# Patient Record
Sex: Female | Born: 1980
Health system: Southern US, Community
[De-identification: ages and names within clinical notes are randomized; demographics above are authoritative.]

## PROBLEM LIST (undated history)

## (undated) HISTORY — PX: BREAST SURGERY: SHX581

---

## 2008-04-15 ENCOUNTER — Encounter: Admission: RE | Admit: 2008-04-15 | Discharge: 2008-04-15 | Payer: Self-pay | Admitting: Family Medicine

## 2008-04-15 ENCOUNTER — Ambulatory Visit: Payer: Self-pay | Admitting: Family Medicine

## 2008-07-14 ENCOUNTER — Encounter: Admission: RE | Admit: 2008-07-14 | Discharge: 2008-07-14 | Payer: Self-pay | Admitting: Family Medicine

## 2008-07-14 ENCOUNTER — Ambulatory Visit: Payer: Self-pay | Admitting: Family Medicine

## 2008-07-14 ENCOUNTER — Telehealth (INDEPENDENT_AMBULATORY_CARE_PROVIDER_SITE_OTHER): Payer: Self-pay | Admitting: *Deleted

## 2008-07-15 ENCOUNTER — Telehealth: Payer: Self-pay | Admitting: Family Medicine

## 2008-07-28 ENCOUNTER — Ambulatory Visit: Payer: Self-pay | Admitting: Family Medicine

## 2008-08-11 ENCOUNTER — Ambulatory Visit: Payer: Self-pay | Admitting: Family Medicine

## 2008-08-11 LAB — CONVERTED CEMR LAB
Nitrite: POSITIVE
Urobilinogen, UA: >=8
pH: 5

## 2008-08-17 ENCOUNTER — Ambulatory Visit: Payer: Self-pay | Admitting: Family Medicine

## 2008-08-17 ENCOUNTER — Telehealth: Payer: Self-pay | Admitting: Family Medicine

## 2008-08-17 LAB — CONVERTED CEMR LAB
Bilirubin Urine: NEGATIVE
Blood in Urine, dipstick: NEGATIVE
Ketones, urine, test strip: NEGATIVE
Nitrite: NEGATIVE

## 2009-02-22 ENCOUNTER — Ambulatory Visit: Payer: Self-pay | Admitting: Family Medicine

## 2009-02-22 DIAGNOSIS — F329 Major depressive disorder, single episode, unspecified: Secondary | ICD-10-CM

## 2009-02-22 DIAGNOSIS — F4323 Adjustment disorder with mixed anxiety and depressed mood: Secondary | ICD-10-CM | POA: Insufficient documentation

## 2009-02-25 ENCOUNTER — Ambulatory Visit: Payer: Self-pay | Admitting: Family Medicine

## 2009-03-07 ENCOUNTER — Telehealth: Payer: Self-pay | Admitting: Family Medicine

## 2009-07-14 ENCOUNTER — Ambulatory Visit: Payer: Self-pay | Admitting: Family Medicine

## 2010-02-02 ENCOUNTER — Ambulatory Visit: Payer: Self-pay | Admitting: Family Medicine

## 2010-02-22 ENCOUNTER — Telehealth: Payer: Self-pay | Admitting: Family Medicine

## 2010-04-13 ENCOUNTER — Telehealth: Payer: Self-pay | Admitting: Family Medicine

## 2010-06-19 ENCOUNTER — Ambulatory Visit: Payer: Self-pay | Admitting: Family Medicine

## 2010-06-19 LAB — CONVERTED CEMR LAB: Beta hcg, urine, semiquantitative: POSITIVE

## 2010-08-08 NOTE — Assessment & Plan Note (Signed)
Summary: Left breast pain, sinusitis   Vital Signs:  Patient profile:   29 year old female Height:      70 inches Weight:      182 pounds Temp:     97.8 degrees F oral Pulse rate:   78 / minute BP sitting:   122 / 79  (left arm) Cuff size:   regular  Vitals Entered By: Kathlene November (July 14, 2009 10:05 AM) CC: infection in left breast   Primary Care Provider:  Nani Gasser MD  CC:  infection in left breast.  History of Present Illness: Pain in the breast feels similar as before.  Can feel a hard lump. NO redness. Has a burning sensation. Last time it drained.  NO fever or chills. No pain relievers or compresses. Noticed it a week ago.  Last infection was earlier this year and follwed up with her breast surgeon who felt it was unrelated the augmetation.    Has had a URI for 4 weeks. Started with nausea and then got a severe head cold and dry cough, ST and ear ache. Then resolved for about 3 days and then started again.  Last week has mostly been a persistant cough, worse at night.  Still having ST, and post nasal drip. Nasal drianage is green/brown. No SOB.  Was taking sudafed for it - helped some. Had nipples peirced when younger but hasn't had rings in for almost 10 years. but that is where it drianed last time.   She is worried about the numbner of infections she has had this year.  has had 3 root canals for abscess teeth.  2 breast infections. She sais dhe had negative HIV testing earlier this year.   Current Medications (verified): 1)  Seasonique 0.15-0.03 &0.01 Mg Tabs (Levonorgest-Eth Estrad 91-Day) .... Take One Tablet By Mouth Oncea  Day 2)  Meclizine Hcl 25 Mg Tabs (Meclizine Hcl) .Marland Kitchen.. 1 Tab By Mouth Two Times A Day As Needed Vertigo 3)  Promethazine Hcl 25 Mg Tabs (Promethazine Hcl) .... One By Mouth Every 6 Hours As Needed For Nausea  Allergies (verified): No Known Drug Allergies  Comments:  Nurse/Medical Assistant: The patient's medications and allergies  were reviewed with the patient and were updated in the Medication and Allergy Lists. Kathlene November (July 14, 2009 10:06 AM)  Past History:  Past Surgical History: Breast augmentation 2009.    Physical Exam  General:  Well-developed,well-nourished,in no acute distress; alert,appropriate and cooperative throughout examination Head:  Normocephalic and atraumatic without obvious abnormalities. No apparent alopecia or balding. Eyes:  No corneal or conjunctival inflammation noted. EOMI. Perrla. Ears:  External ear exam shows no significant lesions or deformities.  Otoscopic examination reveals clear canals, tympanic membranes are intact bilaterally without bulging, retraction, inflammation or discharge. Hearing is grossly normal bilaterally. Nose:  External nasal examination shows no deformity or inflammation. Mouth:  Oral mucosa and oropharynx without lesions or exudates.  Teeth in good repair. Neck:  No deformities, masses, or tenderness noted. Breasts:  Left breast with some firmness on the inferior edge of teh areola. Also tender here but no erythema.  No asymmetry. NO axillary LN on the left. No nipple discharge on exam.  Lungs:  Normal respiratory effort, chest expands symmetrically. Lungs are clear to auscultation, no crackles or wheezes. Heart:  Normal rate and regular rhythm. S1 and S2 normal without gallop, murmur, click, rub or other extra sounds. Pulses:  Radial 2+  Skin:  no rashes.   Cervical  Nodes:  No lymphadenopathy noted Psych:  Cognition and judgment appear intact. Alert and cooperative with normal attention span and concentration. No apparent delusions, illusions, hallucinations   Impression & Recommendations:  Problem # 1:  BREAST PAIN, LEFT (ICD-611.71) Will go ahead and treat wtih ABX. Also discussed possible referal to the breast center for further eval for why 2nd infection in exact same location.  She is losing her insurance next week so prefers to hold off on the  referral. Call if pain and tenderness don't improve rapidly.    Problem # 2:  SINUSITIS - ACUTE-NOS (ICD-461.9)  The following medications were removed from the medication list:    Augmentin 875-125 Mg Tabs (Amoxicillin-pot clavulanate) .Marland Kitchen... Take 1 tablet by mouth two times a day for 10 days Her updated medication list for this problem includes:    Bactrim Ds 800-160 Mg Tabs (Sulfamethoxazole-trimethoprim) .Marland Kitchen... Take 1 tablet by mouth two times a day for 14 days  Instructed on treatment. Call if symptoms persist or worsen.   Complete Medication List: 1)  Seasonique 0.15-0.03 &0.01 Mg Tabs (Levonorgest-eth estrad 91-day) .... Take one tablet by mouth oncea  day 2)  Meclizine Hcl 25 Mg Tabs (Meclizine hcl) .Marland Kitchen.. 1 tab by mouth two times a day as needed vertigo 3)  Promethazine Hcl 25 Mg Tabs (Promethazine hcl) .... One by mouth every 6 hours as needed for nausea 4)  Bactrim Ds 800-160 Mg Tabs (Sulfamethoxazole-trimethoprim) .... Take 1 tablet by mouth two times a day for 14 days 5)  Diflucan 150 Mg Tabs (Fluconazole) .... One by mouth x 1 . Prescriptions: DIFLUCAN 150 MG TABS (FLUCONAZOLE) one by mouth x 1 .  #1 x 1   Entered and Authorized by:   Nani Gasser MD   Signed by:   Nani Gasser MD on 07/14/2009   Method used:   [facility] to        [facility]       [address]       [place], [place]  [address]       Ph: [phone]       Fax: [phone]   RxID:   [id number] BACTRIM DS 800-160 MG TABS (SULFAMETHOXAZOLE-TRIMETHOPRIM) Take 1 tablet by mouth two times a day for 14 days  #28 x 0   Entered and Authorized by:   Nani Gasser MD   Signed by:   Nani Gasser MD on 07/14/2009   Method used:   [id number]       [address]       [place], [place]  [address]       Ph: [phone]       Fax: [phone]   RxID:   [id number]

## 2010-08-08 NOTE — Progress Notes (Signed)
Summary: refill  Phone Note Call from Patient   Caller: Patient Call For: Nani Gasser MD Summary of Call: Pt states she has been taking 2 of the CLonazepam and said you told her she could do this and now needs a new script [facility] to [facility] with new directions for them to fill Initial call taken by: Kathlene November LPN,  April 13, 2010 11:05 AM  Follow-up for Phone Call        Rx Called In Follow-up by: Nani Gasser MD,  April 13, 2010 12:05 PM    New/Updated Medications: KLONOPIN 1 MG TABS (CLONAZEPAM) Take 1 tablet by mouth two times a day as needed panic attacks    needed for panic attacks. Prescriptions: KLONOPIN 1 MG TABS (CLONAZEPAM) Take 1 tablet by mouth two times a day as needed panic attacks    needed for panic attacks.  #60 x 0   Entered and Authorized by:   Nani Gasser MD   Signed by:   Nani Gasser MD on 04/13/2010   Method used:   Printed then faxed to ...       [facility] [address] [facility]       [address]       [place], [place]  [address]       Ph: [phone]       Fax: [phone]   RxID:   [id number]

## 2010-08-08 NOTE — Letter (Signed)
Summary: Depression Questionnaire  Depression Questionnaire   Imported By: Lanelle Bal 03/09/2010 09:38:35  _____________________________________________________________________  External Attachment:    Type:   Image     Comment:   External Document

## 2010-08-08 NOTE — Assessment & Plan Note (Signed)
Summary: Mood, L br pain   Vital Signs:  Patient profile:   30 year old female Height:      70 inches Weight:      175 pounds BMI:     25.20 Pulse rate:   104 / minute BP sitting:   119 / 81  (left arm) Cuff size:   regular  Vitals Entered By: Avon Gully CMA, Duncan Dull) (February 02, 2010 4:20 PM) CC: f/u mood, not feeling well feeling depressed lately,left breast pain   Primary Care Provider:  Nani Gasser MD  CC:  f/u mood, not feeling well feeling depressed lately, and left breast pain.  History of Present Illness: f/u mood, not feeling well feeling depressed lately.  Feels really down on herself. Plans on going to [employer].  Feels down like she did as a teenager.  Needs something until then.  Cyring daily. Overeating.  Feels she really wouldn't kill herself but has cut herself in the last month. She is off her psych meds.  Says hasn't felt this depressed since HS. She lives with her mom and feels her mom is supportive but has not shared with hermom having suicidal thoughts.   She the left breast on the side and underneath the nipple now constantly hurts.  Describs as a soreness. Never really goes away. This is th esame breast she had an infection on previously. She does have implants.   Current Medications (verified): 1)  Ocella 3-0.03 Mg Tabs (Drospirenone-Ethinyl Estradiol)  Allergies (verified): No Known Drug Allergies  Comments:  Nurse/Medical Assistant: The patient's medications and allergies were reviewed with the patient and were updated in the Medication and Allergy Lists. Avon Gully CMA, Duncan Dull) (February 02, 2010 4:22 PM)  Physical Exam  General:  Well-developed,well-nourished,in no acute distress; alert,appropriate and cooperative throughout examination Breasts:  Left breat with no nodules, lumps or changes of the nipple. Scar well healed. No erythema. Mildy tender below the nipple level and laterally. No axiilary LN.  Psych:  Oriented X3 and depressed  affect.  Tearful.    Impression & Recommendations:  Problem # 1:  DEPRESSION (ICD-311) Discussed restarting citalorpam since worked well in the past, no SE in the past, and cheap.  Encouraged her to be safe and not be alone. Encouraged her to share with a friend or mom how she is feeling unstil she is able to start counseling at [facility].  Also will start trazodone for sleep. She has used this in the past and has done well with it. She is also getting panic attacks which she has not had since HS so will start klonopin as needed. Discussed short term use of this med. F/u in 3-6 weeks.  Call here, go to ED or call 911 if thinks she may hurt herself.  PHQ-9 today is score 18 ( mod sever).  Her updated medication list for this problem includes:    Citalopram Hydrobromide 20 Mg Tabs (Citalopram hydrobromide) .Marland Kitchen... Take 1 tablet by mouth once a day    Trazodone Hcl 50 Mg Tabs (Trazodone hcl) .Marland Kitchen... 1/2 - 1 tab by mouth nightly for insomnia    Klonopin 1 Mg Tabs (Clonazepam) .Marland Kitchen... 1/2 - 1 tab by mouth once daily as needed for panic attacks.  Problem # 2:  BREAST PAIN, LEFT (ICD-611.71) Doesn't feel like the infection she had before.  No abnormal exam excpet for the tenderness. Since has implants recommend referral to the breast clnic for further evaluation. Pt without insurance right now so pt will let  me know when she feels she can go.   Complete Medication List: 1)  Citalopram Hydrobromide 20 Mg Tabs (Citalopram hydrobromide) .... Take 1 tablet by mouth once a day 2)  Ocella 3-0.03 Mg Tabs (Drospirenone-ethinyl estradiol) 3)  Trazodone Hcl 50 Mg Tabs (Trazodone hcl) .... 1/2 - 1 tab by mouth nightly for insomnia 4)  Klonopin 1 Mg Tabs (Clonazepam) .... 1/2 - 1 tab by mouth once daily as needed for panic attacks.  Patient Instructions: 1)  Citalopram start with one half tab daily. After one week increase to whole tab.  If need to can increase to 2 tabs after 3 week.  2)  Can start the trazodone for  bedtime.  Prescriptions: KLONOPIN 1 MG TABS (CLONAZEPAM) 1/2 - 1 tab by mouth once daily as needed for panic attacks.  #30 x 0   Entered and Authorized by:   Nani Gasser MD   Signed by:   Nani Gasser MD on 02/02/2010   Method used:   Printed then faxed to ...       [facility] [address] [facility]       [address]       [place], [place]  [address]       Ph: [phone]       Fax: [phone]   RxID:   [id number] TRAZODONE HCL 50 MG TABS (TRAZODONE HCL) 1/2 - 1 tab by mouth nightly for insomnia  #30 x 1   Entered and Authorized by:   Nani Gasser MD   Signed by:   Nani Gasser MD on 02/02/2010   Method used:   Electronically to        [facility]       [address]       [place], [place]  [address]       Ph: [phone]       Fax: [phone]   RxID:   [id number]

## 2010-08-08 NOTE — Progress Notes (Signed)
  Phone Note Call from Patient   Caller: Patient Call For: Nani Gasser MD Summary of Call: pt wants referral for psych Initial call taken by: Avon Gully CMA, Duncan Dull),  February 22, 2010 12:04 PM

## 2010-08-10 NOTE — Assessment & Plan Note (Signed)
Summary: Pregnancy   Vital Signs:  Patient profile:   30 year old female Height:      70 inches Weight:      169 pounds Pulse rate:   69 / minute BP sitting:   111 / 73  (right arm) Cuff size:   regular  Vitals Entered By: Avon Gully CMA, Duncan Dull) (June 19, 2010 10:59 AM) CC: 14 days late,2 post preg test, stopped BCP 2 months ago   Primary Care Johara Lodwick:  Nani Gasser MD  CC:  14 days late, 2 post preg test, and stopped BCP 2 months ago.  History of Present Illness: 14 days late,2 post preg test, stopped BCP 2 months ago. First day of LMP 05/06/2010.  She does not want this pregnancy. She has had some abdominal pain and bloating since she found out she was pregnant but she feels this is mental beacuase she doesn't want to be pregnant. Her mom is here with her and she is very supportive.   Current Medications (verified): 1)  Citalopram Hydrobromide 20 Mg Tabs (Citalopram Hydrobromide) .... Take 1 Tablet By Mouth Once A Day 2)  Trazodone Hcl 50 Mg Tabs (Trazodone Hcl) .... 1/2 - 1 Tab By Mouth Nightly For Insomnia 3)  Klonopin 1 Mg Tabs (Clonazepam) .... Take 1 Tablet By Mouth Two Times A Day As Needed Panic Attacks    Needed For Panic Attacks.  Allergies (verified): No Known Drug Allergies  Comments:  Nurse/Medical Assistant: The patient's medications and allergies were reviewed with the patient and were updated in the Medication and Allergy Lists. Avon Gully CMA, Duncan Dull) (June 19, 2010 11:00 AM)  Physical Exam  General:  Well-developed,well-nourished,in no acute distress; alert,appropriate and cooperative throughout examination Lungs:  Normal respiratory effort, chest expands symmetrically. Lungs are clear to auscultation, no crackles or wheezes. Heart:  Normal rate and regular rhythm. S1 and S2 normal without gallop, murmur, click, rub or other extra sounds. Abdomen:  Bowel sounds positive,abdomen soft and non-tender without masses, organomegaly or  hernias noted.   Impression & Recommendations:  Problem # 1:  PREGNANCY, NORMAL (ICD-V22.2)  Discussed options. She wouldlike to terminate the pregnancy and is interested in the "abortion pill".  Called and referred [facility] to [facility] in [place] or the [facility] with Dr. Arlyce Dice.    Orders: Urine Pregnancy (CPT-81025)  Problem # 2:  DEPRESSION (ICD-311) Adjusted her medication. Aske her to call me if she feels her mood is doing down.  Her updated medication list for this problem includes:    Citalopram Hydrobromide 20 Mg Tabs (Citalopram hydrobromide) .Marland Kitchen... Take 2  tablet by mouth once a day    Trazodone Hcl 50 Mg Tabs (Trazodone hcl) .Marland Kitchen... 1/2 - 1 tab by mouth nightly for insomnia    Klonopin 1 Mg Tabs (Clonazepam) .Marland Kitchen... Take 1 tablet by mouth two times a day as needed panic attacks    needed for panic attacks.  Complete Medication List: 1)  Citalopram Hydrobromide 20 Mg Tabs (Citalopram hydrobromide) .... Take 2  tablet by mouth once a day 2)  Trazodone Hcl 50 Mg Tabs (Trazodone hcl) .... 1/2 - 1 tab by mouth nightly for insomnia 3)  Klonopin 1 Mg Tabs (Clonazepam) .... Take 1 tablet by mouth two times a day as needed panic attacks    needed for panic attacks. Prescriptions: CITALOPRAM HYDROBROMIDE 20 MG TABS (CITALOPRAM HYDROBROMIDE) Take 2  tablet by mouth once a day  #60 x 3   Entered and Authorized by:  Nani Gasser MD   Signed by:   Nani Gasser MD on 06/19/2010   Method used:   Electronically to        [facility] [address] ([facility])       [address]       [place], [place]  [address]       Ph: [phone]       Fax: [phone]   RxID:   [id number]    Orders Added: 1)  Urine Pregnancy [CPT-81025] 2)  Est. Patient Level II [53664]    Laboratory Results   Urine Tests  Date/Time Received: 06/19/10 Date/Time Reported: 06/19/10    Urine HCG: positive

## 2010-11-12 ENCOUNTER — Other Ambulatory Visit: Payer: Self-pay | Admitting: Family Medicine

## 2011-02-14 ENCOUNTER — Encounter: Payer: Self-pay | Admitting: Family Medicine

## 2011-02-15 ENCOUNTER — Ambulatory Visit (INDEPENDENT_AMBULATORY_CARE_PROVIDER_SITE_OTHER): Payer: Self-pay | Admitting: Family Medicine

## 2011-02-15 ENCOUNTER — Other Ambulatory Visit: Payer: Self-pay | Admitting: Family Medicine

## 2011-02-15 ENCOUNTER — Encounter: Payer: Self-pay | Admitting: Family Medicine

## 2011-02-15 VITALS — BP 116/70 | HR 65 | Ht 70.5 in | Wt 169.0 lb

## 2011-02-15 DIAGNOSIS — N760 Acute vaginitis: Secondary | ICD-10-CM

## 2011-02-15 DIAGNOSIS — E119 Type 2 diabetes mellitus without complications: Secondary | ICD-10-CM

## 2011-02-15 DIAGNOSIS — R81 Glycosuria: Secondary | ICD-10-CM

## 2011-02-15 DIAGNOSIS — R7309 Other abnormal glucose: Secondary | ICD-10-CM

## 2011-02-15 LAB — POCT GLYCOSYLATED HEMOGLOBIN (HGB A1C): Hemoglobin A1C: 5.1

## 2011-02-15 LAB — POCT URINALYSIS DIPSTICK
Nitrite, UA: NEGATIVE
Spec Grav, UA: 1.025

## 2011-02-15 MED ORDER — FLUCONAZOLE 150 MG PO TABS: ORAL_TABLET | ORAL | Status: DC

## 2011-02-15 NOTE — Progress Notes (Signed)
  Subjective:    Patient ID: Priscilla Cook, female    DOB: Jun 03, 1981, 30 y.o.   MRN: [id number]  HPI Having recurrent yeast infection for the last year. Now having vaginal tiching again. No dysuria or hematuria.  No fever or back pain.   Yeast infection seems to clear up after with OTC tx.  Then sx will recur after intercourse.  Her boyfriend is circumcised and hasn't had any rashes. Went to UC recently and was told had sugar in her urine but her fingerstick was 99.  Mat GF with Diabetes and father with DM.      Review of Systems     Objective:   Physical Exam  Constitutional: She appears well-developed and well-nourished.  HENT:  Head: Normocephalic and atraumatic.          Assessment & Plan:  Vaginitis- Discussed likly yeast. Will call in diflucan. Will call with wet prep results. Will need to look up recommendation for recurrent yeast. Reviewed guidelines and they recommend tx Day 1, 3, and 6. Also they don't recommend treating the partner. Also consider a maintenance dose but want to confirm this occurrence.   Abnormal glucose - It is unusual to have a glucose of 99 but spill sugar in the urine so will recheck the urine today as well as an A1C.  A1c was normal. I reassured her she has no sign of diabetes. UA was negative for glucosuria.

## 2011-02-16 LAB — WET PREP, GENITAL
Trich, Wet Prep: NONE SEEN
Yeast Wet Prep HPF POC: NONE SEEN

## 2011-02-17 ENCOUNTER — Telehealth: Payer: Self-pay | Admitting: Family Medicine

## 2011-02-17 NOTE — Telephone Encounter (Signed)
Call pt: Wet prep is neg.

## 2011-02-19 NOTE — Telephone Encounter (Signed)
Pt notified. KJ LPN 

## 2011-06-12 ENCOUNTER — Other Ambulatory Visit: Payer: Self-pay | Admitting: Family Medicine

## 2011-08-24 ENCOUNTER — Ambulatory Visit (INDEPENDENT_AMBULATORY_CARE_PROVIDER_SITE_OTHER): Payer: Self-pay | Admitting: Physician Assistant

## 2011-08-24 VITALS — BP 137/87 | HR 91 | Temp 98.3°F

## 2011-08-24 DIAGNOSIS — N390 Urinary tract infection, site not specified: Secondary | ICD-10-CM

## 2011-08-24 LAB — POCT URINALYSIS DIPSTICK
Bilirubin, UA: NEGATIVE
Leukocytes, UA: NEGATIVE
Protein, UA: NEGATIVE
Urobilinogen, UA: 1

## 2011-08-24 MED ORDER — FLUCONAZOLE 150 MG PO TABS: 150.0000 mg | ORAL_TABLET | Freq: Once | ORAL | Status: AC

## 2011-08-24 MED ORDER — CIPROFLOXACIN HCL 250 MG PO TABS: 250.0000 mg | ORAL_TABLET | Freq: Two times a day (BID) | ORAL | Status: AC

## 2011-08-24 NOTE — Progress Notes (Signed)
  Subjective:    Patient ID: Priscilla Cook August, female    DOB: 15-Jun-1981, 31 y.o.   MRN: [id number]  HPI   Pt c/o urinary urgency since last pm, denies pain. Hx of UTI's.  Review of Systems     Objective:   Physical Exam        Assessment & Plan:

## 2011-08-31 ENCOUNTER — Telehealth: Payer: Self-pay | Admitting: *Deleted

## 2011-08-31 MED ORDER — SULFAMETHOXAZOLE-TMP DS 800-160 MG PO TABS: 1.0000 | ORAL_TABLET | Freq: Two times a day (BID) | ORAL | Status: DC

## 2011-08-31 MED ORDER — SULFAMETHOXAZOLE-TMP DS 800-160 MG PO TABS: 1.0000 | ORAL_TABLET | Freq: Two times a day (BID) | ORAL | Status: AC

## 2011-08-31 NOTE — Telephone Encounter (Signed)
Pt informed

## 2011-08-31 NOTE — Telephone Encounter (Signed)
Looks like Cipro was prescribed on 2/15. Has she taken this medication?

## 2011-08-31 NOTE — Telephone Encounter (Signed)
Pt is out of town and states that she is still having blood in urine and painful urination. Would like to know if you can send her something else in to [facility] [place], [place]. Please advise.

## 2011-08-31 NOTE — Telephone Encounter (Signed)
She has taken it.

## 2011-08-31 NOTE — Telephone Encounter (Signed)
IF patient is still having symptoms after this treatment she will need to come in for repeat urinalysis. Sent Bactrim DS BID for 3 days.

## 2011-09-06 ENCOUNTER — Telehealth: Payer: Self-pay | Admitting: *Deleted

## 2011-09-06 MED ORDER — FLUCONAZOLE 150 MG PO TABS: ORAL_TABLET | ORAL | Status: DC

## 2011-09-06 NOTE — Telephone Encounter (Signed)
Pt says that she has a yeast infection. Pt was given Diflucan back on 08/24/11 by Lesly Rubenstein. Pt would like to know if we can call her in another Diflucan.

## 2011-09-06 NOTE — Telephone Encounter (Signed)
OK to refill

## 2011-09-06 NOTE — Telephone Encounter (Signed)
Pt informed

## 2012-09-22 ENCOUNTER — Ambulatory Visit (INDEPENDENT_AMBULATORY_CARE_PROVIDER_SITE_OTHER): Payer: Managed Care, Other (non HMO) | Admitting: Physician Assistant

## 2012-09-22 ENCOUNTER — Encounter: Payer: Self-pay | Admitting: Physician Assistant

## 2012-09-22 VITALS — BP 140/84 | HR 95 | Temp 98.4°F | Wt 174.0 lb

## 2012-09-22 DIAGNOSIS — J019 Acute sinusitis, unspecified: Secondary | ICD-10-CM

## 2012-09-22 DIAGNOSIS — F329 Major depressive disorder, single episode, unspecified: Secondary | ICD-10-CM

## 2012-09-22 DIAGNOSIS — F3289 Other specified depressive episodes: Secondary | ICD-10-CM

## 2012-09-22 DIAGNOSIS — R059 Cough, unspecified: Secondary | ICD-10-CM

## 2012-09-22 MED ORDER — FLUCONAZOLE 150 MG PO TABS: 150.0000 mg | ORAL_TABLET | Freq: Once | ORAL | Status: DC

## 2012-09-22 MED ORDER — AMOXICILLIN-POT CLAVULANATE 875-125 MG PO TABS: 1.0000 | ORAL_TABLET | Freq: Two times a day (BID) | ORAL | Status: DC

## 2012-09-22 MED ORDER — CITALOPRAM HYDROBROMIDE 20 MG PO TABS: ORAL_TABLET | ORAL | Status: DC

## 2012-09-22 MED ORDER — HYDROCODONE-HOMATROPINE 5-1.5 MG/5ML PO SYRP: 5.0000 mL | ORAL_SOLUTION | Freq: Every evening | ORAL | Status: DC | PRN

## 2012-09-22 NOTE — Progress Notes (Signed)
  Subjective:    Patient ID: Priscilla Cook, female    DOB: 29-Mar-1981, 32 y.o.   MRN: [id number]  HPI Patient is a 32 year old female who presents to the clinic with cough for 2 weeks that has been productive with green sputum. In the last week her sinus pressure has started. Her ear still very congested but no pain or discharge. She denies a sore throat that her neck felt swollen and tender to touch. She has been working 2 jobs and not sleeping very much. She has been taking vitamin C. She denies any fever, chills, nausea, vomiting, or muscle leg. She has headaches almost daily and. Congested. She has not tried a decongestant. She denies any chest pains or palpitations.   Patient has started to notice that her depression is getting worse. She recently lost her home 2 to finances. She has started to notice she feels like she's going to cry all the time and gets very overwhelmed. She denies any thoughts of suicide. She is currently working 2 jobs and things are going a little better but seems like to wait to say her home.    Review of Systems     Objective:   Physical Exam  Constitutional: She is oriented to person, place, and time. She appears well-developed and well-nourished.  HENT:  Head: Normocephalic and atraumatic.  TMs clear bilaterally with good light reflexes.  Oropharynx is red with postnasal drip present. Tonsils are normal with no exudate.  Bilateral turbinates are red and swollen with rhinorrhea.  Bilateral maxillary sinuses are tender to palpation.  Eyes: Conjunctivae are normal.  Neck: Normal range of motion. Neck supple.  Bilateral anterior cervical lymph nodes are swollen and tender to touch.  Cardiovascular: Normal rate, regular rhythm and normal heart sounds.   Pulmonary/Chest: Effort normal and breath sounds normal. She has no wheezes.  Neurological: She is alert and oriented to person, place, and time.  Skin:  Cheeks are flushed.  Psychiatric: Her behavior is  normal.  Tearful during encounter when talks about her financial situation.          Assessment & Plan:  Sinusitis/cough/elevated blood pressure-advised patient to continue with symptomatic treatment for the next 2 days and if not improving could try Augmentin. Patient has a history of yeast infection so Diflucan was also given. Patient told to start with Mucinex D. twice a day drinking lots of water. Discussed delsym for daytime cough and hycodan for cough at night. Elevated blood pressure is most likely due to current treatments we'll continue to monitor. Patient was advised to check BP once feeling better at the pharmacy.   Depression- we'll start back on Celexa 20 mg one half tab for 7 days then increase to 1 full tab. Followup in 4-6 weeks. Call if any worsening thoughts or depression.

## 2012-09-22 NOTE — Patient Instructions (Signed)
Mucinex D twice a day.  

## 2013-04-06 ENCOUNTER — Ambulatory Visit (INDEPENDENT_AMBULATORY_CARE_PROVIDER_SITE_OTHER): Payer: Managed Care, Other (non HMO) | Admitting: Family Medicine

## 2013-04-06 ENCOUNTER — Encounter: Payer: Self-pay | Admitting: Family Medicine

## 2013-04-06 VITALS — BP 133/83 | HR 78 | Wt 180.0 lb

## 2013-04-06 DIAGNOSIS — N644 Mastodynia: Secondary | ICD-10-CM

## 2013-04-06 DIAGNOSIS — L719 Rosacea, unspecified: Secondary | ICD-10-CM

## 2013-04-06 MED ORDER — METRONIDAZOLE 0.75 % EX CREA: TOPICAL_CREAM | Freq: Two times a day (BID) | CUTANEOUS | Status: DC

## 2013-04-06 NOTE — Patient Instructions (Addendum)
Rosacea Rosacea is a long-term (chronic) condition that affects the skin of the face (cheeks, nose, brow, and chin) and sometimes the eyes. Rosacea causes the blood vessels near the surface of the skin to enlarge, resulting in redness. This condition usually begins after age 30. It occurs most often in light-skinned women. Without treatment, rosacea tends to get worse over time. There is no cure for rosacea, but treatment can help control your symptoms. CAUSES  The cause is unknown. It is thought that some people may inherit a tendency to develop rosacea. Certain triggers can make your rosacea worse, including:  Hot baths.  Exercise.  Sunlight.  Very hot or cold temperatures.  Hot or spicy foods and drinks.  Drinking alcohol.  Stress.  Taking blood pressure medicine.  Long-term use of topical steroids on the face. SYMPTOMS   Redness of the face.  Red bumps or pimples on the face.  Red, enlarged nose (rhinophyma).  Blushing easily.  Red lines on the skin.  Irritated or burning feeling in the eyes.  Swollen eyelids. DIAGNOSIS  Your caregiver can usually tell what is wrong by asking about your symptoms and performing a physical exam. TREATMENT  Avoiding triggers is an important part of treatment. You will also need to see a skin specialist (dermatologist) who can develop a treatment plan for you. The goals of treatment are to control your condition and to improve the appearance of your skin. It may take several weeks or months of treatment before you notice an improvement in your skin. Even after your skin improves, you will likely need to continue treatment to prevent your rosacea from coming back. Treatment methods may include:  Using sunscreen or sunblock daily to protect the skin.  Antibiotic medicine, such as metronidazole, applied directly to the skin.  Antibiotics taken by mouth. This is usually prescribed if you have eye problems from your rosacea.  Laser surgery  to improve the appearance of the skin. This surgery can reduce the appearance of red lines on the skin and can remove excess tissue from the nose to reduce its size. HOME CARE INSTRUCTIONS  Avoid things that seem to trigger your flare-ups.  If you are given antibiotics, take them as directed. Finish them even if you start to feel better.  Use a gentle facial cleanser that does not contain alcohol.  You may use a mild facial moisturizer.  Use a sunscreen or sunblock with SPF 30 or greater.  Wear a green-tinted foundation powder to conceal redness, if needed. Choose cosmetics that are noncomedogenic. This means they do not block your pores.  If your eyelids are affected, apply warm compresses to the eyelids. Do this up to 4 times a day or as directed by your caregiver. SEEK MEDICAL CARE IF:  Your skin problems get worse.  You feel depressed.  You lose your appetite.  You have trouble concentrating.  You have problems with your eyes, such as redness or itching. MAKE SURE YOU:  Understand these instructions.  Will watch your condition.  Will get help right away if you are not doing well or get worse. Document Released: 08/02/2004 Document Revised: 12/25/2011 Document Reviewed: 06/05/2011 [employer] Patient Information 2014 [employer].  

## 2013-04-06 NOTE — Progress Notes (Signed)
Subjective:    Patient ID: Priscilla Cook August, female    DOB: 1981/02/02, 32 y.o.   MRN: [id number]  HPI left breast x 3wks hx of infection. has not noticed any swelling or redness. Thinks maybe had hard area.  No fever chills or sweat. No recent ABX.  Feels like a sharp pain when not wearing a bra.    She would also like to discuss the skin changes on her face. She's noticed over the last year she's getting a lot of erythema on her cheeks and some breakouts of acne on her chin which is unusual for her. She's pretty sure she has rosacea. She did try using apple cider vinegar to clean her face and says it was painful and irritated for about 3 days. She is on NuvaRing for birth control as well.  Review of Systems  BP 133/83  Pulse 78  Wt 180 lb (81.647 kg)  BMI 25.45 kg/m2    No Known Allergies  No past medical history on file.  Past Surgical History  Procedure Laterality Date  . Breast surgery      augmentation    History   Social History  . Marital Status: Single    Spouse Name: N/A    Number of Children: N/A  . Years of Education: N/A   Occupational History  . Not on file.   Social History Main Topics  . Smoking status: Current Every Day Smoker  . Smokeless tobacco: Not on file  . Alcohol Use: Yes  . Drug Use: No  . Sexual Activity:    Other Topics Concern  . Not on file   Social History Narrative  . No narrative on file    Family History  Problem Relation Age of Onset  . Heart attack      grandparent  . Cancer      grandparent  . Depression      grandparent  . Diabetes Father     Outpatient Encounter Prescriptions as of 04/06/2013  Medication Sig Dispense Refill  . etonogestrel-ethinyl estradiol (NUVARING) 0.12-0.015 MG/24HR vaginal ring Place 1 each vaginally every 28 (twenty-eight) days. Insert vaginally and leave in place for 3 consecutive weeks, then remove for 1 week.      . [DISCONTINUED] citalopram (CELEXA) 20 MG tablet Take 1/2 tab for 7 days  then increase to 1 tab.  30 tablet  1  . metroNIDAZOLE (METROCREAM) 0.75 % cream Apply topically 2 (two) times daily.  45 g  1  . [DISCONTINUED] amoxicillin-clavulanate (AUGMENTIN) 875-125 MG per tablet Take 1 tablet by mouth 2 (two) times daily.  20 tablet  0  . [DISCONTINUED] fluconazole (DIFLUCAN) 150 MG tablet Take 1 tablet (150 mg total) by mouth once.  1 tablet  0  . [DISCONTINUED] HYDROcodone-homatropine (HYCODAN) 5-1.5 MG/5ML syrup Take 5 mLs by mouth at bedtime as needed for cough.  120 mL  0   No facility-administered encounter medications on file as of 04/06/2013.          Objective:   Physical Exam  Constitutional: She is oriented to person, place, and time. She appears well-developed and well-nourished.  HENT:  Head: Normocephalic and atraumatic.  Eyes: Conjunctivae are normal. Pupils are equal, round, and reactive to light.  Pulmonary/Chest:  Left left breast appears normal. She has had an implant. Surgical scar well healed. I do feel a half centimeter soft palpable cyst at the edge of the area around the 6:00 position. It is tender to touch. No skin  changes. No sign of erythema induration or scaling. No discharge from the nipple. No axillary lymphadenopathy on the left.  Neurological: She is alert and oriented to person, place, and time.  Skin: Skin is warm and dry.  Erythematous cheekc with some fine acne on her chin.   Psychiatric: She has a normal mood and affect. Her behavior is normal.          Assessment & Plan:  Left breat pain -  we discussed options. This is consistent with a cyst. She is going to have her period next week so be sure to see if after she has her cycle if it goes away it improves. If it does not she will call me and we will move forward with an ultrasound of the breast.  We also discussed minimizing caffeine which can simulate process. Certainly we could move forward and ultrasound before she went for her cycle if she would like to she opted to  wait until after her cycle.  Rosacea-Discussed main tx is lifestyle and then topical medications can be treied. Given rx for topical metronidazole.  H.O given.

## 2013-06-06 ENCOUNTER — Other Ambulatory Visit: Payer: Self-pay | Admitting: Physician Assistant

## 2013-06-25 ENCOUNTER — Encounter: Payer: Self-pay | Admitting: Family Medicine

## 2013-06-25 ENCOUNTER — Ambulatory Visit (INDEPENDENT_AMBULATORY_CARE_PROVIDER_SITE_OTHER): Payer: Managed Care, Other (non HMO) | Admitting: Family Medicine

## 2013-06-25 VITALS — BP 121/77 | HR 83 | Temp 98.8°F | Ht 71.0 in | Wt 177.0 lb

## 2013-06-25 DIAGNOSIS — R197 Diarrhea, unspecified: Secondary | ICD-10-CM

## 2013-06-25 DIAGNOSIS — R1013 Epigastric pain: Secondary | ICD-10-CM

## 2013-06-25 NOTE — Patient Instructions (Signed)
Diet for Gastroesophageal Reflux Disease, Adult  Reflux is when stomach acid flows up into the esophagus. The esophagus becomes irritated and sore (inflammation). When reflux happens often and is severe, it is called gastroesophageal reflux disease (GERD). What you eat can help ease any discomfort caused by GERD.  FOODS OR DRINKS TO AVOID OR LIMIT   Coffee and black tea, with or without caffeine.   Bubbly (carbonated) drinks with caffeine or energy drinks.   Strong spices, such as pepper, cayenne pepper, curry, or chili powder.   Peppermint or spearmint.   Chocolate.   High-fat foods, such as meats, fried food, oils, butter, or nuts.   Fruits and vegetables that cause discomfort. This includes citrus fruits and tomatoes.   Alcohol.  If a certain food or drink irritates your GERD, avoid eating or drinking it.  THINGS THAT MAY HELP GERD INCLUDE:   Eat meals slowly.   Eat 5 to 6 small meals a day, not 3 large meals.   Do not eat food for a certain amount of time if it causes discomfort.   Wait 3 hours after eating before lying down.   Keep the head of your bed raised 6 to 9 inches (15 23 centimeters). Put a foam wedge or blocks under the legs of the bed.   Stay active. Weight loss, if needed, may help ease your discomfort.   Wear loose-fitting clothing.   Do not smoke or chew tobacco.  Document Released: 12/25/2011 Document Reviewed: 12/25/2011  [employer] Patient Information 2014 [employer].

## 2013-06-25 NOTE — Progress Notes (Signed)
Subjective:    Patient ID: Priscilla Cook, female    DOB: Jul 06, 1981, 32 y.o.   MRN: [id number]  HPI Abd pain - upper gastric pain w/diarrhea esp. after eating x2 1/2 wks. Will start while eating sometimes.  She does eat a lot of spicy foods. She has cut back on her caffeine since she had breast cysts. She says she doesn't really think a lot of soda. Feels nauseated. No vomiting.  No blood in the stool.  Stools are loose and at times watery.  No traveling outside the county.  No camping or drinking creek water, etc.  Diarrhea has actually been going on longer than 2-1/2 weeks, approximately a month.   No antibiotics over the last 90 days. No known triggers. She says typically she'll be, a discomfort in the epigastric area, and then have diarrhea.     Review of Systems     BP 121/77  Pulse 83  Temp(Src) 98.8 F (37.1 C)  Ht 5\' 11"  (1.803 m)  Wt 177 lb (80.287 kg)  BMI 24.70 kg/m2    No Known Allergies  No past medical history on file.  Past Surgical History  Procedure Laterality Date  . Breast surgery      augmentation    History   Social History  . Marital Status: Single    Spouse Name: N/A    Number of Children: N/A  . Years of Education: N/A   Occupational History  . Not on file.   Social History Main Topics  . Smoking status: Current Every Day Smoker  . Smokeless tobacco: Not on file  . Alcohol Use: Yes  . Drug Use: No  . Sexual Activity:    Other Topics Concern  . Not on file   Social History Narrative  . No narrative on file    Family History  Problem Relation Age of Onset  . Heart attack      grandparent  . Cancer      grandparent  . Depression      grandparent  . Diabetes Father     Outpatient Encounter Prescriptions as of 06/25/2013  Medication Sig  . etonogestrel-ethinyl estradiol (NUVARING) 0.12-0.015 MG/24HR vaginal ring Place 1 each vaginally every 28 (twenty-eight) days. Insert vaginally and leave in place for 3 consecutive weeks,  then remove for 1 week.  . metroNIDAZOLE (METROCREAM) 0.75 % cream Apply topically 2 (two) times daily.  . [DISCONTINUED] citalopram (CELEXA) 20 MG tablet TAKE 1/2 TAB FOR 7 DAYS THEN INCREASE TO 1 TAB.       Objective:   Physical Exam  Constitutional: She is oriented to person, place, and time. She appears well-developed and well-nourished.  HENT:  Head: Normocephalic and atraumatic.  Neck: Neck supple. No thyromegaly present.  Cardiovascular: Normal rate, regular rhythm and normal heart sounds.   Pulmonary/Chest: Effort normal and breath sounds normal.  Abdominal: Soft. Bowel sounds are normal. She exhibits no distension and no mass. There is tenderness. There is no rebound and no guarding.  Mild tenderness in the epigastric area  Lymphadenopathy:    She has no cervical adenopathy.  Neurological: She is alert and oriented to person, place, and time.  Skin: Skin is warm and dry.  Psychiatric: She has a normal mood and affect. Her behavior is normal.          Assessment & Plan:  Epigastric pain-likely related to GERD, gastritis, versus gastric ulcer. Recommend starting an over-the-counter PPI such as affect or Nexium. Recommend a  2030 minutes before first milk a day. Cut back on eating greasy spicy or acidic foods. She says she does eat a lot of spicy foods. Given handout on additional information on things to avoid with gastric reflux. Check liver enzymes and pancreatic enzymes.  Diarrhea-I'm sure this is related to the epigastric pain as it started well before the epigastric discomfort started. I would like to get stool cultures and C. difficile. She has not had any recent antibiotic exposure so it is low risk but at this point she's been having diarrhea for almost a month. She has not seen any blood in the stool. She does get some cramping with it. Stay well hydrated. Increase fiber in the diet. Will check CBC with differential as well.

## 2013-06-26 LAB — CBC WITH DIFFERENTIAL/PLATELET
Eosinophils Relative: 0 % (ref 0–5)
HCT: 38.8 % (ref 36.0–46.0)
Hemoglobin: 13.3 g/dL (ref 12.0–15.0)
Lymphocytes Relative: 37 % (ref 12–46)
Lymphs Abs: 2.7 10*3/uL (ref 0.7–4.0)
MCH: 32.8 pg (ref 26.0–34.0)
MCHC: 34.3 g/dL (ref 30.0–36.0)
MCV: 95.6 fL (ref 78.0–100.0)
Monocytes Absolute: 0.8 10*3/uL (ref 0.1–1.0)
Monocytes Relative: 11 % (ref 3–12)
Neutrophils Relative %: 51 % (ref 43–77)
RBC: 4.06 MIL/uL (ref 3.87–5.11)
RDW: 13.4 % (ref 11.5–15.5)

## 2013-06-27 LAB — COMPLETE METABOLIC PANEL WITH GFR
AST: 14 U/L (ref 0–37)
Alkaline Phosphatase: 31 U/L — ABNORMAL LOW (ref 39–117)
CO2: 26 mEq/L (ref 19–32)
GFR, Est African American: >89 mL/min
Potassium: 4.2 mEq/L (ref 3.5–5.3)
Sodium: 139 mEq/L (ref 135–145)
Total Bilirubin: 0.6 mg/dL (ref 0.3–1.2)
Total Protein: 7.1 g/dL (ref 6.0–8.3)

## 2013-06-27 LAB — LIPASE: Lipase: 10 U/L (ref 0–75)

## 2013-06-29 NOTE — Progress Notes (Signed)
Quick Note:  Call pt: all lAbs are normal. See how she is feeling.  ______

## 2013-10-16 ENCOUNTER — Encounter: Payer: Self-pay | Admitting: Physician Assistant

## 2013-10-16 ENCOUNTER — Ambulatory Visit (INDEPENDENT_AMBULATORY_CARE_PROVIDER_SITE_OTHER): Payer: Managed Care, Other (non HMO) | Admitting: Physician Assistant

## 2013-10-16 VITALS — BP 116/72 | HR 85 | Temp 98.7°F | Ht 71.0 in | Wt 186.0 lb

## 2013-10-16 DIAGNOSIS — H6980 Other specified disorders of Eustachian tube, unspecified ear: Secondary | ICD-10-CM

## 2013-10-16 DIAGNOSIS — H6982 Other specified disorders of Eustachian tube, left ear: Secondary | ICD-10-CM

## 2013-10-16 MED ORDER — FLUCONAZOLE 150 MG PO TABS: 150.0000 mg | ORAL_TABLET | Freq: Once | ORAL | Status: DC

## 2013-10-16 MED ORDER — AZITHROMYCIN 250 MG PO TABS: ORAL_TABLET | ORAL | Status: DC

## 2013-10-16 MED ORDER — METHYLPREDNISOLONE 4 MG PO KIT: PACK | ORAL | Status: DC

## 2013-10-16 NOTE — Patient Instructions (Signed)
Start medrol dose pak. Continue flonase daily. Finish amoxil.

## 2013-10-19 NOTE — Progress Notes (Signed)
   Subjective:    Patient ID: Priscilla Cook, female    DOB: 08/26/1980, 33 y.o.   MRN: [id number]  HPI Pt presents to the clinic with ST and acute left ear pain for last 2 days. 1 week ago she had other symptoms and went to urgent care and was given amoxicillin for sinusitis. Sinus pressure has resolved. She woke up acutely with the ear pain and ST. She is able to eat and drink but difficult to swallow. ST is mostly on the left as well. Denies any fever, chills, nausea, vomiting. She has taken ibuprofen with minimal relief.    Review of Systems     Objective:   Physical Exam  Constitutional: She is oriented to person, place, and time. She appears well-developed and well-nourished.  HENT:  Head: Normocephalic and atraumatic.  Right Ear: External ear normal.  Left TM slightly bulging with clear fluid.   Oropharynx red. No tonsilar swelling. No exudate.   Bilateral nares red and swollen with rhinorrhea.   Eyes: Conjunctivae are normal. Right eye exhibits no discharge. Left eye exhibits no discharge.  Neck: Normal range of motion. Neck supple.  Cardiovascular: Normal rate, regular rhythm and normal heart sounds.   Pulmonary/Chest: Effort normal and breath sounds normal.  Lymphadenopathy:    She has no cervical adenopathy.  Neurological: She is alert and oriented to person, place, and time.  Skin: Skin is dry.  Psychiatric: She has a normal mood and affect. Her behavior is normal.          Assessment & Plan:  Acute eustachian tube dysfunction/acuet pharyngitis-. Discussed with pt I feel like both symptoms are coming from a eustachian tube dysfunction. Pt concern about strep but I do not feel like strep being on amoxil. I would like for her to continue amoxil. Add OTC flonase 2 sprays each nostril, medrol dose pak, ibuprofen. If not improving can consider taking another round of abx. Diflucan was given if needed to try another round of abx. I encouraged pt to give at least 48 hours  to see if symptoms improve.

## 2013-10-26 ENCOUNTER — Telehealth: Payer: Self-pay | Admitting: *Deleted

## 2013-10-26 NOTE — Telephone Encounter (Signed)
Did she improve while on prednisone?

## 2013-10-26 NOTE — Telephone Encounter (Signed)
It has been 10 days.because improved sounds like due to inflammation and post nasal drip. My suggestion is ibuprofen 800mg  and nasal spray flonase 2 sprays each nostril daily for next couple of days. Call if spike fever etc.

## 2013-10-26 NOTE — Telephone Encounter (Signed)
Pt states she finished the Prednisone pack you put her on and said that this am her throat is swollen on 1 side and hurts pretty bad. Please advise.

## 2013-10-26 NOTE — Telephone Encounter (Signed)
yes

## 2013-10-26 NOTE — Telephone Encounter (Signed)
LMOM with response.  Kalib Bhagat, LPN  

## 2013-10-27 ENCOUNTER — Telehealth: Payer: Self-pay | Admitting: *Deleted

## 2013-10-27 NOTE — Telephone Encounter (Signed)
Make sure she has zpak that i have given her. Continue flonase. Since prednisone did help it is likely still a viral etiology espeically if not cleared with amoxil. Come in tomorrow to look at it. Make sure no sob, problems swallowing.

## 2013-10-27 NOTE — Telephone Encounter (Signed)
Left detailed message on vm asking pt to call for an appt.

## 2013-10-27 NOTE — Telephone Encounter (Signed)
Pt left vm stating that she has already been doing the flonase & she is still unable to sleep at night or swallow & wants to know if that's REALLY post nasal drip.

## 2014-01-30 ENCOUNTER — Other Ambulatory Visit: Payer: Self-pay | Admitting: Family Medicine

## 2014-02-01 ENCOUNTER — Ambulatory Visit (INDEPENDENT_AMBULATORY_CARE_PROVIDER_SITE_OTHER): Payer: Managed Care, Other (non HMO) | Admitting: Family Medicine

## 2014-02-01 ENCOUNTER — Encounter: Payer: Self-pay | Admitting: Family Medicine

## 2014-02-01 ENCOUNTER — Ambulatory Visit (INDEPENDENT_AMBULATORY_CARE_PROVIDER_SITE_OTHER): Payer: Managed Care, Other (non HMO)

## 2014-02-01 VITALS — BP 130/92 | HR 84 | Ht 71.0 in | Wt 189.0 lb

## 2014-02-01 DIAGNOSIS — M79672 Pain in left foot: Secondary | ICD-10-CM

## 2014-02-01 DIAGNOSIS — M8430XA Stress fracture, unspecified site, initial encounter for fracture: Secondary | ICD-10-CM

## 2014-02-01 DIAGNOSIS — M84375A Stress fracture, left foot, initial encounter for fracture: Secondary | ICD-10-CM

## 2014-02-01 DIAGNOSIS — M79609 Pain in unspecified limb: Secondary | ICD-10-CM

## 2014-02-01 NOTE — Progress Notes (Signed)
   Subjective:    Patient ID: Priscilla Cook, female    DOB: 08/11/1980, 33 y.o.   MRN: [id number]  HPI Left foot pain x2 weeks. She denies any injury. She does her dropping anything on her foot. She says it's been sore to walk on. She describes the pain is localized to the top of the foot mostly over the second third fourth and fifth metatarsal heads. She says it's been swelling almost every day. She has been taking ibuprofen for pain relief. She said she did have some pain in her calf muscle for couple days but that has actually gotten a little bit better that she's had some intermittent tightness.  She did mention that she actually started running for exercise a few weeks ago and actually stopped because she started experiencing some foot pain. It actually got a little bit better and then over the last 2 weeks has been very painful even to walk. Review of Systems     Objective:   Physical Exam  Constitutional: She is oriented to person, place, and time. She appears well-developed and well-nourished.  HENT:  Head: Normocephalic and atraumatic.  Eyes: Conjunctivae are normal. Pupils are equal, round, and reactive to light.  Musculoskeletal:  Left foot pain over the third and fourth metatarsal heads of the left foot. She actually has some swelling but no erythema. No bruising. She has pain when she flexes the foot in that area. Ankle with normal range of motion and strength. Dorsal pedal pulse 2+. No ankle swelling. No pain over the calf muscle.  Neurological: She is oriented to person, place, and time.  Skin: Skin is warm and dry.  Psychiatric: She has a normal mood and affect. Her behavior is normal.          Assessment & Plan:  Left foot pain-she does have point tenderness over the distal third and fourth metatarsal heads. Will get x-ray to evaluate for fracture.   Stress fracture, 3rd MT - Xray shows healing stress fracture at distal 3rd MT. Fitted with a postop shoe. Can take off  to shower and sleep et Karie Sodacetera. Followup in 2 weeks. There is Re: some callus formation which is just trying to heal.  .

## 2014-02-15 ENCOUNTER — Ambulatory Visit (INDEPENDENT_AMBULATORY_CARE_PROVIDER_SITE_OTHER): Payer: Managed Care, Other (non HMO) | Admitting: Family Medicine

## 2014-02-15 ENCOUNTER — Encounter: Payer: Self-pay | Admitting: Family Medicine

## 2014-02-15 ENCOUNTER — Ambulatory Visit (INDEPENDENT_AMBULATORY_CARE_PROVIDER_SITE_OTHER): Payer: Managed Care, Other (non HMO)

## 2014-02-15 VITALS — BP 134/81 | HR 77 | Wt 189.0 lb

## 2014-02-15 DIAGNOSIS — M8430XA Stress fracture, unspecified site, initial encounter for fracture: Secondary | ICD-10-CM

## 2014-02-15 DIAGNOSIS — S92302D Fracture of unspecified metatarsal bone(s), left foot, subsequent encounter for fracture with routine healing: Secondary | ICD-10-CM

## 2014-02-15 DIAGNOSIS — M79609 Pain in unspecified limb: Secondary | ICD-10-CM

## 2014-02-15 DIAGNOSIS — S8290XD Unspecified fracture of unspecified lower leg, subsequent encounter for closed fracture with routine healing: Secondary | ICD-10-CM

## 2014-02-15 NOTE — Progress Notes (Signed)
   Subjective:    Patient ID: Priscilla Cook, female    DOB: 05/12/1981, 33 y.o.   MRN: [id number]  HPI Stress fracture - f/U for 3rd MT distal fracture. She says still feels sore but overall better. Not able to walk on it without pressure.  She's not taking any medications. She wants and if it's okay to ice it. She has also been using an Ace wrap because she says it feels a little bit more comfortable with that.  Review of Systems     Objective:   Physical Exam  Very tender over distal 3rd MT on left foot.  No bruising or discoloration. No significant swelling. She did come in with a postop boot on today.      Assessment & Plan:  Stress fracture 3rd MT shows increased calcification which idicates healing. Continue to wear post op shoe for 2-3 weeks. Then f/u to recheck foot.

## 2014-03-12 ENCOUNTER — Encounter: Payer: Self-pay | Admitting: Family Medicine

## 2014-03-12 ENCOUNTER — Ambulatory Visit (INDEPENDENT_AMBULATORY_CARE_PROVIDER_SITE_OTHER): Payer: Managed Care, Other (non HMO) | Admitting: Family Medicine

## 2014-03-12 VITALS — BP 125/83 | HR 84 | Wt 188.0 lb

## 2014-03-12 DIAGNOSIS — M8430XA Stress fracture, unspecified site, initial encounter for fracture: Secondary | ICD-10-CM

## 2014-03-12 DIAGNOSIS — M84375D Stress fracture, left foot, subsequent encounter for fracture with routine healing: Secondary | ICD-10-CM

## 2014-03-12 DIAGNOSIS — Z13220 Encounter for screening for lipoid disorders: Secondary | ICD-10-CM

## 2014-03-12 NOTE — Progress Notes (Signed)
   Subjective:    Patient ID: Priscilla Cook, female    DOB: Oct 14, 1980, 33 y.o.   MRN: [id number]  HPI Here today to followup for stress fracture of the third metatarsal on the left foot. She says she's been consistent with wearing the postop shoe except when she is home she has been able to go without it without any pain walking. She still has a little bit of tenderness if she presses on the area. No swelling.   Review of Systems     Objective:   Physical Exam  Still a little tender over the 3rd distal MT on the left foot.  No swelling. Able to ambulate normally       Assessment & Plan:  Stress fracture-healing well. She's able to discontinue the postop shoe. If it starts to hurt during the daytime she can certainly tissue back onto the breast today if she needs to. Encourage her to progress slowly over the next few days and let pain be her guide. Discussed ways ot ease back into exercise.

## 2014-11-03 ENCOUNTER — Ambulatory Visit (INDEPENDENT_AMBULATORY_CARE_PROVIDER_SITE_OTHER): Payer: Managed Care, Other (non HMO) | Admitting: Family Medicine

## 2014-11-03 ENCOUNTER — Encounter: Payer: Self-pay | Admitting: Family Medicine

## 2014-11-03 VITALS — BP 124/81 | HR 83 | Wt 182.0 lb

## 2014-11-03 DIAGNOSIS — R319 Hematuria, unspecified: Secondary | ICD-10-CM | POA: Diagnosis not present

## 2014-11-03 DIAGNOSIS — R300 Dysuria: Secondary | ICD-10-CM

## 2014-11-03 DIAGNOSIS — N3000 Acute cystitis without hematuria: Secondary | ICD-10-CM | POA: Diagnosis not present

## 2014-11-03 DIAGNOSIS — N760 Acute vaginitis: Secondary | ICD-10-CM | POA: Diagnosis not present

## 2014-11-03 LAB — POCT URINALYSIS DIPSTICK
GLUCOSE UA: 100
Ketones, UA: 15
NITRITE UA: POSITIVE
Protein, UA: 100
Spec Grav, UA: 1.01
UROBILINOGEN UA: 4
pH, UA: 5

## 2014-11-03 LAB — WET PREP FOR TRICH, YEAST, CLUE
Clue Cells Wet Prep HPF POC: NONE SEEN
Trich, Wet Prep: NONE SEEN
Yeast Wet Prep HPF POC: NONE SEEN

## 2014-11-03 MED ORDER — SULFAMETHOXAZOLE-TRIMETHOPRIM 800-160 MG PO TABS: 1.0000 | ORAL_TABLET | Freq: Two times a day (BID) | ORAL | Status: DC

## 2014-11-03 NOTE — Addendum Note (Signed)
Addended by: Deno EtienneBARKLEY, Jane Birkel L on: 11/03/2014 12:00 PM   Modules accepted: Orders

## 2014-11-03 NOTE — Progress Notes (Signed)
   Subjective:    Patient ID: Priscilla Cook, female    DOB: 07/12/1980, 34 y.o.   MRN: [id number]  HPI UTI - pt reports that she used [facility] and was prescribed abx and diflucan and was told that if her sxs were not better in 36 hours to f/u with her pcp she began taking azo on Sunday.  She was on her honeymoon and wearing a wet bathing suit for a week when sxs began.  She has had burning with urination and sensitivity in the groin.  No swelling. . No rash.  Has noticed a discharge as well.  No rash.      Review of Systems     Objective:   Physical Exam  Constitutional: She is oriented to person, place, and time. She appears well-developed and well-nourished.  HENT:  Head: Normocephalic and atraumatic.  Eyes: Conjunctivae and EOM are normal.  Cardiovascular: Normal rate.   Pulmonary/Chest: Effort normal.  Neurological: She is alert and oriented to person, place, and time.  Skin: Skin is dry. No pallor.  Psychiatric: She has a normal mood and affect. Her behavior is normal.          Assessment & Plan:  UTI - will tx with Bactrim since not improvement with nitrofurantoin. Will send a culture since failed first ABX.  Vaginitis - will check for yeast infection.

## 2014-11-05 LAB — URINE CULTURE: Colony Count: 4000

## 2016-11-05 LAB — HM PAP SMEAR: HM PAP: NEGATIVE

## 2017-05-07 ENCOUNTER — Encounter: Payer: Self-pay | Admitting: Family Medicine

## 2017-05-07 ENCOUNTER — Telehealth: Payer: Self-pay | Admitting: Family Medicine

## 2017-05-07 ENCOUNTER — Ambulatory Visit (INDEPENDENT_AMBULATORY_CARE_PROVIDER_SITE_OTHER): Payer: PRIVATE HEALTH INSURANCE | Admitting: Family Medicine

## 2017-05-07 VITALS — BP 134/67 | HR 66 | Ht 71.0 in | Wt 210.0 lb

## 2017-05-07 DIAGNOSIS — F32A Depression, unspecified: Secondary | ICD-10-CM

## 2017-05-07 DIAGNOSIS — N938 Other specified abnormal uterine and vaginal bleeding: Secondary | ICD-10-CM

## 2017-05-07 DIAGNOSIS — Z23 Encounter for immunization: Secondary | ICD-10-CM | POA: Diagnosis not present

## 2017-05-07 DIAGNOSIS — F5101 Primary insomnia: Secondary | ICD-10-CM | POA: Diagnosis not present

## 2017-05-07 DIAGNOSIS — F329 Major depressive disorder, single episode, unspecified: Secondary | ICD-10-CM | POA: Diagnosis not present

## 2017-05-07 MED ORDER — TRAZODONE HCL 50 MG PO TABS: 25.0000 mg | ORAL_TABLET | Freq: Every evening | ORAL | 3 refills | Status: DC | PRN

## 2017-05-07 MED ORDER — CITALOPRAM HYDROBROMIDE 20 MG PO TABS: ORAL_TABLET | ORAL | 1 refills | Status: DC

## 2017-05-07 NOTE — Telephone Encounter (Signed)
Please call Evern CoreLindel, [facility] for last pap smear. She just had baby about 7 months ago.

## 2017-05-07 NOTE — Progress Notes (Signed)
Subjective:    Patient ID: Priscilla Cook Priscilla Cook), female    DOB: September 10, 1980, 36 y.o.   MRN: [id number]  HPI 36 year old female comes in today to follow-up for depression.  She was previously diagnosed in 2010 a proximally 8 years ago.  She had a baby about 7 months ago and did well initially.  Then about 2-1/2 months ago she started to notice decreased and low mood.  Increased irritability more tearfulness which she says is very much not like her.  Being irritable even with some of her friends.  She says she just kind of kept an eye on things but unfortunately has been persistent.  She is also been suffering from insomnia.  She can typically fall asleep early in the evening but then she is up for the rest of the night.  Particularly if the baby wakes up and she just cannot go back to sleep at all.  She was on trazodone years ago and that actually worked really well for her.  He also reports vaginal bleeding.  She has had persistent daily bleeding and spotting for the last couple of months.  She was started on NuvaRing after her pregnancy.  She is not currently nursing.   Review of Systems   BP 134/67   Pulse 66   Ht 5\' 11"  (1.803 m)   Wt 210 lb (95.3 kg)   SpO2 99%   BMI 29.29 kg/m     No Known Allergies  No past medical history on file.  Past Surgical History:  Procedure Laterality Date  . BREAST SURGERY     augmentation    Social History   Social History  . Marital status: Single    Spouse name: N/A  . Number of children: N/A  . Years of education: N/A   Occupational History  . Not on file.   Social History Main Topics  . Smoking status: Current Every Day Smoker  . Smokeless tobacco: Never Used  . Alcohol use Yes  . Drug use: No  . Sexual activity: Not on file   Other Topics Concern  . Not on file   Social History Narrative  . No narrative on file    Family History  Problem Relation Age of Onset  . Heart attack Unknown        grandparent  . Cancer  Unknown        grandparent  . Depression Unknown        grandparent  . Diabetes Father     Outpatient Encounter Prescriptions as of 05/07/2017  Medication Sig  . etonogestrel-ethinyl estradiol (NUVARING) 0.12-0.015 MG/24HR vaginal ring Place 1 each vaginally every 28 (twenty-eight) days. Insert vaginally and leave in place for 3 consecutive weeks, then remove for 1 week.  . [DISCONTINUED] sulfamethoxazole-trimethoprim (BACTRIM DS,SEPTRA DS) 800-160 MG per tablet Take 1 tablet by mouth 2 (two) times daily.   No facility-administered encounter medications on file as of 05/07/2017.          Objective:   Physical Exam  Constitutional: She is oriented to person, place, and time. She appears well-developed and well-nourished.  HENT:  Head: Normocephalic and atraumatic.  Cardiovascular: Normal rate, regular rhythm and normal heart sounds.   Pulmonary/Chest: Effort normal and breath sounds normal.  Neurological: She is alert and oriented to person, place, and time.  Skin: Skin is warm and dry.  Psychiatric: She has a normal mood and affect. Her behavior is normal.  Assessment & Plan:  Depression - PHQ -9 score of 16.  Discussed optins.  Will restart citalopram.  Start 10mg  QD x 6 days and then inc to 20mg . F/U in 6 wk.   Dysfunctional uterine bleeding-check for anemia.  Will call with results once available.  If thyroid is normal then consider pelvic ultrasound for further evaluation.  She is on the NuvaRing.  Insomnia - will restart trazodone. Did well with this in the past  F/U in 6 weeks.

## 2017-05-08 NOTE — Telephone Encounter (Signed)
Called office, will send.

## 2017-06-25 ENCOUNTER — Other Ambulatory Visit: Payer: Self-pay | Admitting: Family Medicine

## 2017-07-19 ENCOUNTER — Encounter: Payer: Self-pay | Admitting: Family Medicine

## 2017-09-19 ENCOUNTER — Telehealth: Payer: Self-pay | Admitting: Family Medicine

## 2017-09-19 NOTE — Telephone Encounter (Signed)
I called pt to schedule a F/u with pcp on her mood meds and she states that she is good and no longer takes it

## 2018-03-27 DIAGNOSIS — M545 Low back pain: Secondary | ICD-10-CM | POA: Diagnosis not present

## 2018-03-27 DIAGNOSIS — M5416 Radiculopathy, lumbar region: Secondary | ICD-10-CM | POA: Diagnosis not present

## 2018-04-25 DIAGNOSIS — Z01419 Encounter for gynecological examination (general) (routine) without abnormal findings: Secondary | ICD-10-CM | POA: Diagnosis not present

## 2018-04-25 DIAGNOSIS — R87612 Low grade squamous intraepithelial lesion on cytologic smear of cervix (LGSIL): Secondary | ICD-10-CM | POA: Diagnosis not present

## 2018-04-25 DIAGNOSIS — Z6825 Body mass index (BMI) 25.0-25.9, adult: Secondary | ICD-10-CM | POA: Diagnosis not present

## 2018-05-05 DIAGNOSIS — R1011 Right upper quadrant pain: Secondary | ICD-10-CM | POA: Diagnosis not present

## 2018-05-09 ENCOUNTER — Ambulatory Visit (INDEPENDENT_AMBULATORY_CARE_PROVIDER_SITE_OTHER): Payer: BLUE CROSS/BLUE SHIELD | Admitting: Physician Assistant

## 2018-05-09 ENCOUNTER — Encounter: Payer: Self-pay | Admitting: Physician Assistant

## 2018-05-09 ENCOUNTER — Other Ambulatory Visit (HOSPITAL_BASED_OUTPATIENT_CLINIC_OR_DEPARTMENT_OTHER): Payer: BLUE CROSS/BLUE SHIELD

## 2018-05-09 VITALS — BP 117/79 | HR 65 | Ht 71.0 in | Wt 180.0 lb

## 2018-05-09 DIAGNOSIS — Z23 Encounter for immunization: Secondary | ICD-10-CM

## 2018-05-09 DIAGNOSIS — Z8379 Family history of other diseases of the digestive system: Secondary | ICD-10-CM | POA: Diagnosis not present

## 2018-05-09 DIAGNOSIS — R1011 Right upper quadrant pain: Secondary | ICD-10-CM | POA: Diagnosis not present

## 2018-05-09 DIAGNOSIS — R110 Nausea: Secondary | ICD-10-CM | POA: Diagnosis not present

## 2018-05-09 NOTE — Patient Instructions (Addendum)
Will get u/s and follow up with any new symptoms.   Low-Fat Diet for Pancreatitis or Gallbladder Conditions A low-fat diet can be helpful if you have pancreatitis or a gallbladder condition. With these conditions, your pancreas and gallbladder have trouble digesting fats. A healthy eating plan with less fat will help rest your pancreas and gallbladder and reduce your symptoms. What do I need to know about this diet?  Eat a low-fat diet. ? Reduce your fat intake to less than 20-30% of your total daily calories. This is less than 50-60 g of fat per day. ? Remember that you need some fat in your diet. Ask your dietician what your daily goal should be. ? Choose nonfat and low-fat healthy foods. Look for the words "nonfat," "low fat," or "fat free." ? As a guide, look on the label and choose foods with less than 3 g of fat per serving. Eat only one serving.  Avoid alcohol.  Do not smoke. If you need help quitting, talk with your health care provider.  Eat small frequent meals instead of three large heavy meals. What foods can I eat? Grains Include healthy grains and starches such as potatoes, wheat bread, fiber-rich cereal, and brown rice. Choose whole grain options whenever possible. In adults, whole grains should account for 45-65% of your daily calories. Fruits and Vegetables Eat plenty of fruits and vegetables. Fresh fruits and vegetables add fiber to your diet. Meats and Other Protein Sources Eat lean meat such as chicken and pork. Trim any fat off of meat before cooking it. Eggs, fish, and beans are other sources of protein. In adults, these foods should account for 10-35% of your daily calories. Dairy Choose low-fat milk and dairy options. Dairy includes fat and protein, as well as calcium. Fats and Oils Limit high-fat foods such as fried foods, sweets, baked goods, sugary drinks. Other Creamy sauces and condiments, such as mayonnaise, can add extra fat. Think about whether or not you  need to use them, or use smaller amounts or low fat options. What foods are not recommended?  High fat foods, such as: ? [employer]. ? Ice cream. ? [place] toast. ? Sweet rolls. ? Pizza. ? Cheese bread. ? Foods covered with batter, butter, creamy sauces, or cheese. ? Fried foods. ? Sugary drinks and desserts.  Foods that cause gas or bloating This information is not intended to replace advice given to you by your health care provider. Make sure you discuss any questions you have with your health care provider. Document Released: 06/30/2013 Document Revised: 12/01/2015 Document Reviewed: 06/08/2013 Elsevier Interactive Patient Education  2017 [employer].

## 2018-05-09 NOTE — Progress Notes (Signed)
   Subjective:    Patient ID: Priscilla Cook, female    DOB: 11-Apr-1981, 37 y.o.   MRN: [id number]  HPI  Patient is a 37 year old female who presents to the clinic with right upper quadrant pain.  This is the first time this has ever happened.  Her pain started Sunday morning and it almost felt like she had slept wrong.  She was having shooting pains in her right shoulder and mid back progressed into her right upper quadrant.  She felt very nauseated but she did not vomit.  The night before she had eaten at [employer] and had some nachos.  She has no history of acid reflux.  She would rate the pain 7-8 out of 10 from Sunday and Monday.  She went to urgent care and they told her to go to the emergency room to have a ultrasound.  She had to leave for work and did not get the ultrasound.  She did take some ibuprofen and Prilosec.  She denies any stool changes.  Her pain has resolved today.  She does have a family history of gallbladder disease with her father and mother as well as grandmother.  She ate last at 930 this morning.  No one else is sick.  She denies any fever, chills, body aches.  .. Active Ambulatory Problems    Diagnosis Date Noted  . DEPRESSION 02/22/2009   Resolved Ambulatory Problems    Diagnosis Date Noted  . No Resolved Ambulatory Problems   No Additional Past Medical History      Review of Systems See HPI.     Objective:   Physical Exam  Constitutional: She is oriented to person, place, and time. She appears well-developed and well-nourished.  HENT:  Head: Normocephalic and atraumatic.  Cardiovascular: Normal rate and regular rhythm.  Pulmonary/Chest: Effort normal and breath sounds normal.  No CVA tenderness.   Abdominal: Soft. Bowel sounds are normal. She exhibits no distension and no mass. There is no tenderness. There is no rebound and no guarding.  Neurological: She is alert and oriented to person, place, and time.  Skin: No rash noted.  Psychiatric: She has a  normal mood and affect. Her behavior is normal.          Assessment & Plan:  Marland KitchenMarland KitchenDiagnoses and all orders for this visit:  Right upper quadrant abdominal pain -     Cancel: US Abdomen Complete -     US Abdomen Complete  Need for immunization against influenza -     Flu Vaccine QUAD 36+ mos IM  Nausea -     Cancel: US Abdomen Complete -     US Abdomen Complete  Family history of gallbladder disease -     Cancel: US Abdomen Complete -     US Abdomen Complete   Patient's symptoms are very suspicious for gallbladder disease.  Symptoms have resolved today but I would still like to get an ultrasound to evaluate.  She cannot do it today but will schedule in [place] where she lives for next week.  Certainly call us if she has any other episode in between now and then.  I did discuss foods that can exacerbate gallbladder disease.  She may want to try to avoid these foods.  Follow-up as needed.

## 2018-06-13 DIAGNOSIS — R87612 Low grade squamous intraepithelial lesion on cytologic smear of cervix (LGSIL): Secondary | ICD-10-CM | POA: Diagnosis not present

## 2018-06-13 DIAGNOSIS — N879 Dysplasia of cervix uteri, unspecified: Secondary | ICD-10-CM | POA: Diagnosis not present

## 2018-06-13 DIAGNOSIS — R87619 Unspecified abnormal cytological findings in specimens from cervix uteri: Secondary | ICD-10-CM | POA: Diagnosis not present

## 2018-06-13 DIAGNOSIS — D061 Carcinoma in situ of exocervix: Secondary | ICD-10-CM | POA: Diagnosis not present

## 2018-06-13 DIAGNOSIS — R87810 Cervical high risk human papillomavirus (HPV) DNA test positive: Secondary | ICD-10-CM | POA: Diagnosis not present

## 2018-06-13 DIAGNOSIS — N871 Moderate cervical dysplasia: Secondary | ICD-10-CM | POA: Diagnosis not present

## 2018-08-22 DIAGNOSIS — Z3202 Encounter for pregnancy test, result negative: Secondary | ICD-10-CM | POA: Diagnosis not present

## 2018-08-22 DIAGNOSIS — D061 Carcinoma in situ of exocervix: Secondary | ICD-10-CM | POA: Diagnosis not present

## 2018-08-22 DIAGNOSIS — R87613 High grade squamous intraepithelial lesion on cytologic smear of cervix (HGSIL): Secondary | ICD-10-CM | POA: Diagnosis not present

## 2018-08-22 DIAGNOSIS — N871 Moderate cervical dysplasia: Secondary | ICD-10-CM | POA: Diagnosis not present

## 2018-08-22 DIAGNOSIS — B977 Papillomavirus as the cause of diseases classified elsewhere: Secondary | ICD-10-CM | POA: Diagnosis not present

## 2018-08-22 DIAGNOSIS — N72 Inflammatory disease of cervix uteri: Secondary | ICD-10-CM | POA: Diagnosis not present

## 2019-04-29 ENCOUNTER — Ambulatory Visit (INDEPENDENT_AMBULATORY_CARE_PROVIDER_SITE_OTHER): Payer: BC Managed Care – PPO | Admitting: Physician Assistant

## 2019-04-29 VITALS — Ht 71.0 in | Wt 185.0 lb

## 2019-04-29 DIAGNOSIS — R519 Headache, unspecified: Secondary | ICD-10-CM | POA: Diagnosis not present

## 2019-04-29 DIAGNOSIS — R197 Diarrhea, unspecified: Secondary | ICD-10-CM | POA: Diagnosis not present

## 2019-04-29 DIAGNOSIS — R110 Nausea: Secondary | ICD-10-CM | POA: Diagnosis not present

## 2019-04-29 MED ORDER — ONDANSETRON HCL 8 MG PO TABS: 8.0000 mg | ORAL_TABLET | Freq: Three times a day (TID) | ORAL | 0 refills | Status: DC | PRN

## 2019-04-29 NOTE — Progress Notes (Signed)
Patient ID: Priscilla Cook, female   DOB: 1980/07/18, 38 y.o.   MRN: [id number] .Marland KitchenVirtual Visit via Video Note  I connected with Priscilla Cook on 05/01/19 at  9:30 AM EDT by a video enabled telemedicine application and verified that I am speaking with the correct person using two identifiers.  Location: Patient: home Provider: clinic   I discussed the limitations of evaluation and management by telemedicine and the availability of in person appointments. The patient expressed understanding and agreed to proceed.  History of Present Illness: Pt is a 38 yo female who calls in to the clinic with diarrhea, nausea, headache for last 3 days. She works in [employer] with many different people. No covid contacts. Her son started having a fever and rash this morning as well. She is having loose stools about 7 yesterday. None this morning. She has headache that is dull and feels nauseated. She denies any loss of smell or taste, no SOB, cough, sinus pressure. No melena or hematochezia in stools. She is taking ibuprofen and tylenol which helps some. No new foods or questionable food. Pt is very tired and has body aches.   .. Active Ambulatory Problems    Diagnosis Date Noted  . DEPRESSION 02/22/2009  . Right upper quadrant abdominal pain 05/09/2018  . Nausea 05/09/2018  . Family history of gallbladder disease 05/09/2018   Resolved Ambulatory Problems    Diagnosis Date Noted  . No Resolved Ambulatory Problems   No Additional Past Medical History   reveiwed med, allergy, problem list.    Observations/Objective: No acute distress No coughing or labored breathing. Flushed cheeks and appears tired.    Assessment and Plan: Marland KitchenMarland KitchenWillean was seen today for headache and gi problem.  Diagnoses and all orders for this visit:  Diarrhea, unspecified type  Nausea -     ondansetron (ZOFRAN) 8 MG tablet; Take 1 tablet (8 mg total) by mouth every 8 (eight) hours as needed for nausea or  vomiting.  Acute nonintractable headache, unspecified headache type   Cannot rule out COVID. Needs to be tested. Self isolate until test results. Will give note for work. Go to ED with any worsening breathing. Ibuprofen/tylenol ok for headache. BRAT diet. zofran sent for nausea. Call with any new or worsening symptoms.    Follow Up Instructions:    I discussed the assessment and treatment plan with the patient. The patient was provided an opportunity to ask questions and all were answered. The patient agreed with the plan and demonstrated an understanding of the instructions.   The patient was advised to call back or seek an in-person evaluation if the symptoms worsen or if the condition fails to improve as anticipated.   Iran Planas, PA-C

## 2019-04-29 NOTE — Progress Notes (Signed)
3 day history:  Upset stomach/nausea, no vomiting Headache - back of neck Fatigue Body aches  Patient had tried tylenol/excedrin  Yesterday her son developed - fever (100.8), rash on face

## 2019-05-01 ENCOUNTER — Telehealth: Payer: Self-pay | Admitting: Physician Assistant

## 2019-05-01 ENCOUNTER — Encounter: Payer: Self-pay | Admitting: Physician Assistant

## 2019-05-01 NOTE — Telephone Encounter (Signed)
Spoke with patient. She had testing at [facility] in [place], results not back yet. She was told around lunch time today, so she should be getting a call soon. She states she is feeling much better. [place].

## 2019-05-01 NOTE — Telephone Encounter (Signed)
GREAT- thanks

## 2019-05-22 DIAGNOSIS — R87611 Atypical squamous cells cannot exclude high grade squamous intraepithelial lesion on cytologic smear of cervix (ASC-H): Secondary | ICD-10-CM | POA: Diagnosis not present

## 2019-05-22 DIAGNOSIS — Z3201 Encounter for pregnancy test, result positive: Secondary | ICD-10-CM | POA: Diagnosis not present

## 2019-05-22 DIAGNOSIS — Z01419 Encounter for gynecological examination (general) (routine) without abnormal findings: Secondary | ICD-10-CM | POA: Diagnosis not present

## 2019-05-22 DIAGNOSIS — N912 Amenorrhea, unspecified: Secondary | ICD-10-CM | POA: Diagnosis not present

## 2019-05-22 DIAGNOSIS — Z6826 Body mass index (BMI) 26.0-26.9, adult: Secondary | ICD-10-CM | POA: Diagnosis not present

## 2019-05-22 DIAGNOSIS — Z01411 Encounter for gynecological examination (general) (routine) with abnormal findings: Secondary | ICD-10-CM | POA: Diagnosis not present

## 2019-06-08 DIAGNOSIS — Z3687 Encounter for antenatal screening for uncertain dates: Secondary | ICD-10-CM | POA: Diagnosis not present

## 2019-06-15 DIAGNOSIS — Z3481 Encounter for supervision of other normal pregnancy, first trimester: Secondary | ICD-10-CM | POA: Diagnosis not present

## 2019-06-22 DIAGNOSIS — Z3481 Encounter for supervision of other normal pregnancy, first trimester: Secondary | ICD-10-CM | POA: Diagnosis not present

## 2019-06-22 DIAGNOSIS — Z77011 Contact with and (suspected) exposure to lead: Secondary | ICD-10-CM | POA: Diagnosis not present

## 2019-06-29 DIAGNOSIS — R87611 Atypical squamous cells cannot exclude high grade squamous intraepithelial lesion on cytologic smear of cervix (ASC-H): Secondary | ICD-10-CM | POA: Diagnosis not present

## 2019-06-29 DIAGNOSIS — Z3481 Encounter for supervision of other normal pregnancy, first trimester: Secondary | ICD-10-CM | POA: Diagnosis not present

## 2019-06-29 DIAGNOSIS — Z113 Encounter for screening for infections with a predominantly sexual mode of transmission: Secondary | ICD-10-CM | POA: Diagnosis not present

## 2019-06-29 DIAGNOSIS — Z3A09 9 weeks gestation of pregnancy: Secondary | ICD-10-CM | POA: Diagnosis not present

## 2019-07-13 DIAGNOSIS — Z23 Encounter for immunization: Secondary | ICD-10-CM | POA: Diagnosis not present

## 2019-07-13 DIAGNOSIS — O09521 Supervision of elderly multigravida, first trimester: Secondary | ICD-10-CM | POA: Diagnosis not present

## 2019-09-07 DIAGNOSIS — Z363 Encounter for antenatal screening for malformations: Secondary | ICD-10-CM | POA: Diagnosis not present

## 2019-10-23 DIAGNOSIS — R5383 Other fatigue: Secondary | ICD-10-CM | POA: Diagnosis not present

## 2019-11-23 DIAGNOSIS — Z3483 Encounter for supervision of other normal pregnancy, third trimester: Secondary | ICD-10-CM | POA: Diagnosis not present

## 2019-11-23 DIAGNOSIS — Z23 Encounter for immunization: Secondary | ICD-10-CM | POA: Diagnosis not present

## 2019-11-23 DIAGNOSIS — Z3402 Encounter for supervision of normal first pregnancy, second trimester: Secondary | ICD-10-CM | POA: Diagnosis not present

## 2020-01-03 LAB — HM PAP SMEAR: HM Pap smear: NORMAL

## 2020-01-07 DIAGNOSIS — Z3483 Encounter for supervision of other normal pregnancy, third trimester: Secondary | ICD-10-CM | POA: Diagnosis not present

## 2020-01-07 DIAGNOSIS — Z3685 Encounter for antenatal screening for Streptococcus B: Secondary | ICD-10-CM | POA: Diagnosis not present

## 2020-01-07 DIAGNOSIS — Z113 Encounter for screening for infections with a predominantly sexual mode of transmission: Secondary | ICD-10-CM | POA: Diagnosis not present

## 2020-01-14 DIAGNOSIS — O368130 Decreased fetal movements, third trimester, not applicable or unspecified: Secondary | ICD-10-CM | POA: Diagnosis not present

## 2020-01-14 DIAGNOSIS — Z79899 Other long term (current) drug therapy: Secondary | ICD-10-CM | POA: Diagnosis not present

## 2020-01-14 DIAGNOSIS — Z87891 Personal history of nicotine dependence: Secondary | ICD-10-CM | POA: Diagnosis not present

## 2020-01-14 DIAGNOSIS — Z3A37 37 weeks gestation of pregnancy: Secondary | ICD-10-CM | POA: Diagnosis not present

## 2020-01-22 DIAGNOSIS — O26893 Other specified pregnancy related conditions, third trimester: Secondary | ICD-10-CM | POA: Diagnosis not present

## 2020-01-22 DIAGNOSIS — Z3A38 38 weeks gestation of pregnancy: Secondary | ICD-10-CM | POA: Diagnosis not present

## 2020-01-22 DIAGNOSIS — Z87891 Personal history of nicotine dependence: Secondary | ICD-10-CM | POA: Diagnosis not present

## 2020-01-22 DIAGNOSIS — O09523 Supervision of elderly multigravida, third trimester: Secondary | ICD-10-CM | POA: Diagnosis not present

## 2020-01-22 DIAGNOSIS — O34219 Maternal care for unspecified type scar from previous cesarean delivery: Secondary | ICD-10-CM | POA: Diagnosis not present

## 2020-01-22 DIAGNOSIS — R109 Unspecified abdominal pain: Secondary | ICD-10-CM | POA: Diagnosis not present

## 2020-01-22 DIAGNOSIS — O471 False labor at or after 37 completed weeks of gestation: Secondary | ICD-10-CM | POA: Diagnosis not present

## 2020-01-25 DIAGNOSIS — Z3A39 39 weeks gestation of pregnancy: Secondary | ICD-10-CM | POA: Diagnosis not present

## 2020-01-25 DIAGNOSIS — O9852 Other viral diseases complicating childbirth: Secondary | ICD-10-CM | POA: Diagnosis not present

## 2020-01-25 DIAGNOSIS — Z79899 Other long term (current) drug therapy: Secondary | ICD-10-CM | POA: Diagnosis not present

## 2020-01-25 DIAGNOSIS — O34211 Maternal care for low transverse scar from previous cesarean delivery: Secondary | ICD-10-CM | POA: Diagnosis not present

## 2020-01-25 DIAGNOSIS — B009 Herpesviral infection, unspecified: Secondary | ICD-10-CM | POA: Diagnosis not present

## 2020-01-25 DIAGNOSIS — O99214 Obesity complicating childbirth: Secondary | ICD-10-CM | POA: Diagnosis not present

## 2020-01-25 DIAGNOSIS — O34219 Maternal care for unspecified type scar from previous cesarean delivery: Secondary | ICD-10-CM | POA: Diagnosis not present

## 2020-01-25 DIAGNOSIS — Z23 Encounter for immunization: Secondary | ICD-10-CM | POA: Diagnosis not present

## 2020-03-04 DIAGNOSIS — R87611 Atypical squamous cells cannot exclude high grade squamous intraepithelial lesion on cytologic smear of cervix (ASC-H): Secondary | ICD-10-CM | POA: Diagnosis not present

## 2020-03-04 DIAGNOSIS — Z130 Encounter for screening for diseases of the blood and blood-forming organs and certain disorders involving the immune mechanism: Secondary | ICD-10-CM | POA: Diagnosis not present

## 2020-03-04 DIAGNOSIS — Z6831 Body mass index (BMI) 31.0-31.9, adult: Secondary | ICD-10-CM | POA: Diagnosis not present

## 2020-04-19 DIAGNOSIS — R070 Pain in throat: Secondary | ICD-10-CM | POA: Diagnosis not present

## 2020-06-13 DIAGNOSIS — D485 Neoplasm of uncertain behavior of skin: Secondary | ICD-10-CM | POA: Diagnosis not present

## 2020-06-13 DIAGNOSIS — D225 Melanocytic nevi of trunk: Secondary | ICD-10-CM | POA: Diagnosis not present

## 2020-07-18 ENCOUNTER — Ambulatory Visit (INDEPENDENT_AMBULATORY_CARE_PROVIDER_SITE_OTHER): Payer: Managed Care, Other (non HMO)

## 2020-07-18 ENCOUNTER — Other Ambulatory Visit: Payer: Self-pay | Admitting: Physician Assistant

## 2020-07-18 ENCOUNTER — Other Ambulatory Visit: Payer: Self-pay

## 2020-07-18 ENCOUNTER — Encounter: Payer: Self-pay | Admitting: Physician Assistant

## 2020-07-18 ENCOUNTER — Ambulatory Visit (INDEPENDENT_AMBULATORY_CARE_PROVIDER_SITE_OTHER): Payer: Managed Care, Other (non HMO) | Admitting: Physician Assistant

## 2020-07-18 VITALS — BP 112/78 | HR 76 | Wt 221.1 lb

## 2020-07-18 DIAGNOSIS — U071 COVID-19: Secondary | ICD-10-CM | POA: Diagnosis not present

## 2020-07-18 DIAGNOSIS — M549 Dorsalgia, unspecified: Secondary | ICD-10-CM

## 2020-07-18 DIAGNOSIS — R079 Chest pain, unspecified: Secondary | ICD-10-CM | POA: Diagnosis not present

## 2020-07-18 DIAGNOSIS — R059 Cough, unspecified: Secondary | ICD-10-CM | POA: Diagnosis not present

## 2020-07-18 LAB — COMPLETE METABOLIC PANEL WITH GFR
AG Ratio: 1.4 (calc) (ref 1.0–2.5)
ALT: 20 U/L (ref 6–29)
AST: 17 U/L (ref 10–30)
Albumin: 4.4 g/dL (ref 3.6–5.1)
Alkaline phosphatase (APISO): 42 U/L (ref 31–125)
BUN: 13 mg/dL (ref 7–25)
CO2: 25 mmol/L (ref 20–32)
Calcium: 10.3 mg/dL — ABNORMAL HIGH (ref 8.6–10.2)
Chloride: 101 mmol/L (ref 98–110)
Creat: 0.89 mg/dL (ref 0.50–1.10)
GFR, Est African American: 96 mL/min/{1.73_m2} (ref >=60)
GFR, Est Non African American: 83 mL/min/{1.73_m2} (ref >=60)
Globulin: 3.2 g/dL (calc) (ref 1.9–3.7)
Glucose, Bld: 86 mg/dL (ref 65–99)
Potassium: 4.7 mmol/L (ref 3.5–5.3)
Sodium: 136 mmol/L (ref 135–146)
Total Bilirubin: 0.5 mg/dL (ref 0.2–1.2)
Total Protein: 7.6 g/dL (ref 6.1–8.1)

## 2020-07-18 LAB — CBC WITH DIFFERENTIAL/PLATELET
Absolute Monocytes: 573 cells/uL (ref 200–950)
Basophils Absolute: 36 cells/uL (ref 0–200)
Basophils Relative: 0.4 %
Eosinophils Absolute: 73 cells/uL (ref 15–500)
Eosinophils Relative: 0.8 %
HCT: 41.8 % (ref 35.0–45.0)
Hemoglobin: 14.1 g/dL (ref 11.7–15.5)
Lymphs Abs: 2557 cells/uL (ref 850–3900)
MCH: 31.1 pg (ref 27.0–33.0)
MCHC: 33.7 g/dL (ref 32.0–36.0)
MCV: 92.3 fL (ref 80.0–100.0)
MPV: 9.9 fL (ref 7.5–12.5)
Monocytes Relative: 6.3 %
Neutro Abs: 5860 cells/uL (ref 1500–7800)
Neutrophils Relative %: 64.4 %
Platelets: 320 10*3/uL (ref 140–400)
RBC: 4.53 10*6/uL (ref 3.80–5.10)
RDW: 12 % (ref 11.0–15.0)
Total Lymphocyte: 28.1 %
WBC: 9.1 10*3/uL (ref 3.8–10.8)

## 2020-07-18 LAB — D-DIMER, QUANTITATIVE: D-Dimer, Quant: 0.68 mcg/mL FEU — ABNORMAL HIGH (ref <0.50)

## 2020-07-18 MED ORDER — KETOROLAC TROMETHAMINE 60 MG/2ML IM SOLN
60.0000 mg | Freq: Once | INTRAMUSCULAR | Status: AC
  Administered 2020-07-18: 60 mg via INTRAMUSCULAR

## 2020-07-18 NOTE — Progress Notes (Signed)
Morrie Sheldon,   CXR is normal.

## 2020-07-18 NOTE — Progress Notes (Signed)
Subjective:    Patient ID: Priscilla Cook, female    DOB: 18-Jul-1980, 40 y.o.   MRN: [id number]  HPI  Patient is a 40 year old female who is COVID-positive day 10 who presents to the clinic with some chest pain and upper back pain.  This all started last night.  It kept her up all night.  It almost felt like the pain was radiating from her upper back through her chest.  She did have some shortness of breath.  She denies any new fevers, chills, body aches.  She is still recovering from the upper respiratory symptoms of COVID.  She still does feel congested and very fatigued.  She denies any calf pain or tenderness.  She does not have a history of blood clots.  She worried about a blood clot since she has COVID. She did feel like her heart was racing. She did take some ibuprofen and helped. She felt a little better today but she having similar symptoms. No worsening of symptoms with exertion.   .. Active Ambulatory Problems    Diagnosis Date Noted  . DEPRESSION 02/22/2009  . Right upper quadrant abdominal pain 05/09/2018  . Nausea 05/09/2018  . Family history of gallbladder disease 05/09/2018   Resolved Ambulatory Problems    Diagnosis Date Noted  . No Resolved Ambulatory Problems   No Additional Past Medical History       Review of Systems    see HPI.  Objective:   Physical Exam Vitals reviewed.  Constitutional:      Appearance: Normal appearance.  HENT:     Head: Normocephalic.  Neck:     Vascular: No carotid bruit.  Cardiovascular:     Rate and Rhythm: Normal rate and regular rhythm.     Pulses: Normal pulses.     Heart sounds: Normal heart sounds. No murmur heard.   Pulmonary:     Effort: Pulmonary effort is normal.     Breath sounds: Normal breath sounds. No wheezing or rhonchi.  Chest:     Chest wall: No tenderness.  Abdominal:     General: There is no distension.     Palpations: Abdomen is soft.     Tenderness: There is no abdominal tenderness.   Musculoskeletal:     Right lower leg: No edema.     Left lower leg: No edema.     Comments: No calf swelling or tenderness.   Lymphadenopathy:     Cervical: No cervical adenopathy.  Neurological:     General: No focal deficit present.     Mental Status: She is alert and oriented to person, place, and time.  Psychiatric:        Mood and Affect: Mood normal.        Behavior: Behavior normal.           Assessment & Plan:  Marland KitchenMarland KitchenDiagnoses and all orders for this visit:  COVID-19 virus infection -     D-dimer, quantitative (not at [facility]) -     COMPLETE METABOLIC PANEL WITH GFR -     CBC with Differential/Platelet -     DG Chest 2 View  Cough -     D-dimer, quantitative (not at [facility]) -     COMPLETE METABOLIC PANEL WITH GFR -     CBC with Differential/Platelet -     DG Chest 2 View  Upper back pain -     D-dimer, quantitative (not at [facility]) -     COMPLETE METABOLIC PANEL WITH  GFR -     CBC with Differential/Platelet -     DG Chest 2 View -     ketorolac (TORADOL) injection 60 mg  Chest pain, unspecified type -     D-dimer, quantitative (not at [facility]) -     COMPLETE METABOLIC PANEL WITH GFR -     CBC with Differential/Platelet -     DG Chest 2 View -     ketorolac (TORADOL) injection 60 mg   Patient's vitals today are reassuring.  No tachycardia.  She is has normal rate and rhythm. Pulse ox is stable. I do have a low suspicion for pulmonary embolism however she does have nonspecific chest pain and active COVID.  She is also a female and overweight.  We will get a D-dimer and if elevated will get a CTA for evaluation.  We will go ahead and get a stat chest x-ray to look for any worrisome findings or concerns.  CBC and CMP ordered.  Continue with symptomatic care.  Shot of Toradol was given in office today since ibuprofen did help some.  She can resume ibuprofen tomorrow.  Consider ice on chest and back.  Likely this is sequela of COVID symptoms but want to rule out any progression  into pneumonia or embolism.  If xray and labs normal ok to go back to work.

## 2020-07-18 NOTE — Progress Notes (Signed)
Elevated D-dimer with COVID dx needs CTA to rule out PE.

## 2020-07-18 NOTE — Patient Instructions (Signed)
Get labs.  Get CXR.  Costochondritis  Costochondritis is irritation and swelling (inflammation) of the tissue that connects the ribs to the breastbone (sternum). This tissue is called cartilage. Costochondritis causes pain in the front of the chest. Usually, the pain:  Starts slowly.  Is in more than one rib. What are the causes? The exact cause of this condition is not always known. It results from stress on the tissue in the affected area. The cause of this stress could be:  Chest injury.  Exercise or activity, such as lifting.  Very bad coughing. What increases the risk? You are more likely to develop this condition if you:  Are female.  Are 56-37 years old.  Recently started a new exercise or work activity.  Have low levels of vitamin D.  Have a condition that makes you cough often. What are the signs or symptoms? The main symptom of this condition is chest pain. The pain:  Usually starts slowly and can be sharp or dull.  Gets worse with deep breathing, coughing, or exercise.  Gets better with rest.  May be worse when you press on the affected area of your ribs and breastbone. How is this treated? This condition usually goes away on its own over time. Your doctor may prescribe an NSAID, such as ibuprofen. This can help reduce pain and inflammation. Treatment may also include:  Resting and avoiding activities that make pain worse.  Putting heat or ice on the painful area.  Doing exercises to stretch your chest muscles. If these treatments do not help, your doctor may inject a numbing medicine to help relieve the pain. Follow these instructions at home: Managing pain, stiffness, and swelling  If told, put ice on the painful area. To do this: ? Put ice in a plastic bag. ? Place a towel between your skin and the bag. ? Leave the ice on for 20 minutes, 2-3 times a day.  If told, put heat on the affected area. Do this as often as told by your doctor. Use the  heat source that your doctor recommends, such as a moist heat pack or a heating pad. ? Place a towel between your skin and the heat source. ? Leave the heat on for 20-30 minutes. ? Take off the heat if your skin turns bright red. This is very important if you cannot feel pain, heat, or cold. You may have a greater risk of getting burned.      Activity  Rest as told by your doctor.  Do not do anything that makes your pain worse. This includes any activities that use chest, belly (abdomen), and side muscles.  Do not lift anything that is heavier than 10 lb (4.5 kg), or the limit that you are told, until your doctor says that it is safe.  Return to your normal activities as told by your doctor. Ask your doctor what activities are safe for you. General instructions  Take over-the-counter and prescription medicines only as told by your doctor.  Keep all follow-up visits as told by your doctor. This is important. Contact a doctor if:  You have chills or a fever.  Your pain does not go away or it gets worse.  You have a cough that does not go away. Get help right away if:  You are short of breath.  You have very bad chest pain that is not helped by medicines, heat, or ice. These symptoms may be an emergency. Do not wait to see if  the symptoms will go away. Get medical help right away. Call your local emergency services (911 in the [place] Do not drive yourself to the hospital. Summary  Costochondritis is irritation and swelling (inflammation) of the tissue that connects the ribs to the breastbone (sternum).  This condition causes pain in the front of the chest.  Treatment may include medicines, rest, heat or ice, and exercises. This information is not intended to replace advice given to you by your health care provider. Make sure you discuss any questions you have with your health care provider. Document Revised: 05/08/2019 Document Reviewed: 05/08/2019 Elsevier Patient Education   2021 [employer].

## 2020-07-19 ENCOUNTER — Ambulatory Visit (INDEPENDENT_AMBULATORY_CARE_PROVIDER_SITE_OTHER): Payer: Managed Care, Other (non HMO)

## 2020-07-19 DIAGNOSIS — R791 Abnormal coagulation profile: Secondary | ICD-10-CM

## 2020-07-19 DIAGNOSIS — U071 COVID-19: Secondary | ICD-10-CM | POA: Diagnosis not present

## 2020-07-19 MED ORDER — IOHEXOL 350 MG/ML SOLN
100.0000 mL | Freq: Once | INTRAVENOUS | Status: AC | PRN
  Administered 2020-07-19: 100 mL via INTRAVENOUS

## 2020-07-19 NOTE — Progress Notes (Signed)
GREAT news. Negative for PE. Everything has come back normal. You are free to go out of town for work.

## 2020-12-21 ENCOUNTER — Telehealth: Payer: Self-pay | Admitting: Family Medicine

## 2020-12-21 NOTE — Telephone Encounter (Signed)
Call her OB for pap durin her last pregnancy in 2021

## 2020-12-23 NOTE — Telephone Encounter (Signed)
Request for last pap sent

## 2021-01-03 ENCOUNTER — Encounter: Payer: Self-pay | Admitting: Family Medicine

## 2021-01-03 ENCOUNTER — Telehealth (INDEPENDENT_AMBULATORY_CARE_PROVIDER_SITE_OTHER): Payer: Managed Care, Other (non HMO) | Admitting: Family Medicine

## 2021-01-03 VITALS — Ht 71.5 in | Wt 209.0 lb

## 2021-01-03 DIAGNOSIS — Z7689 Persons encountering health services in other specified circumstances: Secondary | ICD-10-CM | POA: Diagnosis not present

## 2021-01-03 DIAGNOSIS — F4323 Adjustment disorder with mixed anxiety and depressed mood: Secondary | ICD-10-CM | POA: Diagnosis not present

## 2021-01-03 MED ORDER — CITALOPRAM HYDROBROMIDE 20 MG PO TABS
ORAL_TABLET | ORAL | 0 refills | Status: DC
Start: 2021-01-03 — End: 2021-02-03

## 2021-01-03 MED ORDER — PHENTERMINE HCL 15 MG PO CAPS: 15.0000 mg | ORAL_CAPSULE | ORAL | 0 refills | Status: DC

## 2021-01-03 MED ORDER — TRAZODONE HCL 150 MG PO TABS: 150.0000 mg | ORAL_TABLET | Freq: Every evening | ORAL | 1 refills | Status: DC | PRN

## 2021-01-03 NOTE — Assessment & Plan Note (Signed)
We discussed treating her mood in addition to the increased hunger signals.  She took citalopram years ago and did well and did not have any significant side effects on the medication I think it is important that we really work on decreasing the anxiety and irritability so that she is able to put a little bit more focus on herself and her health and wellbeing.  We will restart citalopram at 10 mg for 2 weeks and then increase to 20 mg for maintenance.  We will follow-up in 1 month.  Flowsheet Row Video Visit from 01/03/2021 in [facility]  PHQ-9 Total Score 17     GAD 7 : Generalized Anxiety Score 01/03/2021 05/07/2017  Nervous, Anxious, on Edge 3 3  Control/stop worrying 2 3  Worry too much - different things 3 3  Trouble relaxing 3 2  Restless 1 0  Easily annoyed or irritable 1 3  Afraid - awful might happen 3 1  Total GAD 7 Score 16 15  Anxiety Difficulty Somewhat difficult -

## 2021-01-03 NOTE — Assessment & Plan Note (Addendum)
We discussed options- we could consider something like Saxenda or armodafinil we explained how these medications work.  We also discussed phentermine as an option as ultimately her goal is to lose about 30 pounds.  We did discuss potential side effects including elevation of blood pressure, palpitations or chest pain.  She will monitor actually for those and stop activity if she has any symptoms.  Otherwise plan to follow-up in 4 weeks.  We did discuss that this is a short-term medication has not been studied long-term.  New prescription sent to pharmacy.  Goals as below.  Visit #: 1 Starting Weight: 204 lb  Current weight: 204 lb Previous weight: Change in weight: Goal weight: Ideal 165-175 lb.Goal 185 lb Dietary goals:work on getting in protein with each meal.  Exercise goals: stay active.   Medication: phentermine 15mg  cap Follow-up and referrals: 1 mo

## 2021-01-03 NOTE — Progress Notes (Signed)
Virtual Visit via Video Note  I connected with Priscilla Cook on 01/03/21 at  4:20 PM EDT by a video enabled telemedicine application and verified that I am speaking with the correct person using two identifiers.   I discussed the limitations of evaluation and management by telemedicine and the availability of in person appointments. The patient expressed understanding and agreed to proceed.  Patient location: at home Provider location: in office  Subjective:    CC: weight and mood concerns.    HPI: +tired and anxious. Feels angry but not down or depressed. Feels really frustrated with the weight gain since pregnancy.  Had her daughter Priscilla Cook almost exactly a year ago.  After her first child she was able to get down to a weight that she felt comfortable with even though it was not her prepregnancy weight.  But now after having had her second child she really has to struggle to get the baby weight back off.  She really feels like it is impacting her overall mood its making her feel more irritable.  She is also just felt much more anxious and has been having more fearful thoughts of bad things happening since having her last child.  She denies feeling significantly down or depressed.  She really feels like it is more the anxiety and irritability.  She feels angry a lot and feels like she lashes out at her loved one sometimes because of that.  She is about 30 lbs from pre-baby weight. Weight is mostly in the abdomen. Has tried some dieting with Optivia for 6 weeks and lost 18 lbs. Sometimes she feels constantly hungry.  Doesn't feel full. BMI 31.   Having a hard time focusing at work.   Not sleeping well, it is chronic.  She uses trazodone. 150mg  and melatonin and a unisom. This helps but not great   She takes magnesium at night.    Past medical history, Surgical history, Family history not pertinant except as noted below, Social history, Allergies, and medications have been entered into the  medical record, reviewed, and corrections made.   I spent 35 minutes on the day of the encounter to include pre-visit record review, face-to-face time with the patient and post visit ordering of test.   Objective:    General: Speaking clearly in complete sentences without any shortness of breath.  Alert and oriented x3.  Normal judgment. No apparent acute distress.    Impression and Recommendations:    Encounter for weight management We discussed options- we could consider something like Saxenda or armodafinil we explained how these medications work.  We also discussed phentermine as an option as ultimately her goal is to lose about 30 pounds.  We did discuss potential side effects including elevation of blood pressure, palpitations or chest pain.  She will monitor actually for those and stop activity if she has any symptoms.  Otherwise plan to follow-up in 4 weeks.  We did discuss that this is a short-term medication has not been studied long-term.  New prescription sent to pharmacy.  Goals as below.  Visit #: 1 Starting Weight: 204 lb  Current weight: 204 lb Previous weight: Change in weight: Goal weight: Ideal 165-175 lb.Goal 185 lb Dietary goals:work on getting in protein with each meal.  Exercise goals: stay active.   Medication: phentermine 15mg  cap Follow-up and referrals: 1 mo   Adjustment reaction with anxiety and depression We discussed treating her mood in addition to the increased hunger signals.  She took  citalopram years ago and did well and did not have any significant side effects on the medication I think it is important that we really work on decreasing the anxiety and irritability so that she is able to put a little bit more focus on herself and her health and wellbeing.  We will restart citalopram at 10 mg for 2 weeks and then increase to 20 mg for maintenance.  We will follow-up in 1 month.  Flowsheet Row Video Visit from 01/03/2021 in [facility]  PHQ-9 Total Score 17      GAD 7 : Generalized Anxiety Score 01/03/2021 05/07/2017  Nervous, Anxious, on Edge 3 3  Control/stop worrying 2 3  Worry too much - different things 3 3  Trouble relaxing 3 2  Restless 1 0  Easily annoyed or irritable 1 3  Afraid - awful might happen 3 1  Total GAD 7 Score 16 15  Anxiety Difficulty Somewhat difficult -       No orders of the defined types were placed in this encounter.   Meds ordered this encounter  Medications   citalopram (CELEXA) 20 MG tablet    Sig: Take 0.5 tablets (10 mg total) by mouth daily for 14 days, THEN 1 tablet (20 mg total) daily for 16 days.    Dispense:  30 tablet    Refill:  0   traZODone (DESYREL) 150 MG tablet    Sig: Take 1 tablet (150 mg total) by mouth at bedtime as needed for sleep.    Dispense:  90 tablet    Refill:  1   phentermine 15 MG capsule    Sig: Take 1 capsule (15 mg total) by mouth every morning.    Dispense:  30 capsule    Refill:  0   Goal Weight: Ideal 165-175 lb.Goal 185 lb  I discussed the assessment and treatment plan with the patient. The patient was provided an opportunity to ask questions and all were answered. The patient agreed with the plan and demonstrated an understanding of the instructions.   The patient was advised to call back or seek an in-person evaluation if the symptoms worsen or if the condition fails to improve as anticipated.   Nani Gasser, MD

## 2021-02-03 ENCOUNTER — Encounter: Payer: Self-pay | Admitting: Family Medicine

## 2021-02-03 ENCOUNTER — Telehealth (INDEPENDENT_AMBULATORY_CARE_PROVIDER_SITE_OTHER): Payer: Managed Care, Other (non HMO) | Admitting: Family Medicine

## 2021-02-03 VITALS — Temp 97.8°F | Ht 71.5 in | Wt 202.0 lb

## 2021-02-03 DIAGNOSIS — F4323 Adjustment disorder with mixed anxiety and depressed mood: Secondary | ICD-10-CM

## 2021-02-03 DIAGNOSIS — Z7689 Persons encountering health services in other specified circumstances: Secondary | ICD-10-CM

## 2021-02-03 MED ORDER — CITALOPRAM HYDROBROMIDE 20 MG PO TABS: 20.0000 mg | ORAL_TABLET | Freq: Every day | ORAL | 1 refills | Status: DC

## 2021-02-03 MED ORDER — PHENTERMINE HCL 37.5 MG PO CAPS: 37.5000 mg | ORAL_CAPSULE | ORAL | 0 refills | Status: DC

## 2021-02-03 NOTE — Progress Notes (Signed)
Virtual Visit via Video Note  I connected with Priscilla Cook on 02/03/21 at  1:20 PM EDT by a video enabled telemedicine application and verified that I am speaking with the correct person using two identifiers.   I discussed the limitations of evaluation and management by telemedicine and the availability of in person appointments. The patient expressed understanding and agreed to proceed.  Patient location: at home Provider location: in office  Subjective:    CC: weight loss  HPI:  Felt a little jittery when first started it.  Says even felt a little nauseated the first 2 weeks. Says more satisfied with smaller amounts.  Much less bloating.  When home eats. Walks a lot of work.  Has a pool at home.  Walks 20,000 step per day.    Past medical history, Surgical history, Family history not pertinant except as noted below, Social history, Allergies, and medications have been entered into the medical record, reviewed, and corrections made.    Objective:    General: Speaking clearly in complete sentences without any shortness of breath.  Alert and oriented x3.  Normal judgment. No apparent acute distress.    Impression and Recommendations:    Encounter for weight management Visit #: 2 Starting Weight: 209 lb   Current weight: 202 lb Previous weight:209 lb  Change in weight:down 7 lbs.  Goal weight: Ideal 165-175 lb.Goal 185 lb Dietary goals:work on getting in protein with each meal.  Exercise goals: stay active. add resistance bands to week. ermine 15mg  cap Follow-up and  Medication: Increase phentermine to 37.5mg   Follow up : 1 mo  Adjustment reaction with anxiety and depression She is really noticed a big difference and having more good days than bad days still occasionally has her moments of irritability but feels like the medication is really kicking in and starting to be effective she is up to the full 20 mg she did switch pharmacies to [facility] and so needs a new  prescription sent there.  She is also using the trazodone again and that has been helpful to improve sleep quality.  Her PHQ-9 score went from 17 down to 9 so that some is to 50% improvement in daily symptoms.  No orders of the defined types were placed in this encounter.   Meds ordered this encounter  Medications   phentermine 37.5 MG capsule    Sig: Take 1 capsule (37.5 mg total) by mouth every morning.    Dispense:  30 capsule    Refill:  0   citalopram (CELEXA) 20 MG tablet    Sig: Take 1 tablet (20 mg total) by mouth daily.    Dispense:  90 tablet    Refill:  1     I discussed the assessment and treatment plan with the patient. The patient was provided an opportunity to ask questions and all were answered. The patient agreed with the plan and demonstrated an understanding of the instructions.   The patient was advised to call back or seek an in-person evaluation if the symptoms worsen or if the condition fails to improve as anticipated.   , MD

## 2021-02-03 NOTE — Assessment & Plan Note (Signed)
She is really noticed a big difference and having more good days than bad days still occasionally has her moments of irritability but feels like the medication is really kicking in and starting to be effective she is up to the full 20 mg she did switch pharmacies to [facility] and so needs a new prescription sent there.  She is also using the trazodone again and that has been helpful to improve sleep quality.

## 2021-02-03 NOTE — Assessment & Plan Note (Signed)
Visit #: 2 Starting Weight: 209 lb  Current weight: 202 lb Previous weight:209 lb  Change in weight:down 7 lbs.  Goal weight: Ideal 165-175 lb.Goal 185 lb Dietary goals:work on getting in protein with each meal.  Exercise goals: stay active. add resistance bands to week. ermine 15mg  cap Follow-up and  Medication: Increase phentermine to 37.5mg   Follow up : 1 mo

## 2021-02-28 ENCOUNTER — Other Ambulatory Visit: Payer: Self-pay | Admitting: Family Medicine

## 2021-03-10 ENCOUNTER — Encounter: Payer: Self-pay | Admitting: Family Medicine

## 2021-03-10 ENCOUNTER — Telehealth (INDEPENDENT_AMBULATORY_CARE_PROVIDER_SITE_OTHER): Payer: Managed Care, Other (non HMO) | Admitting: Family Medicine

## 2021-03-10 VITALS — Ht 72.0 in | Wt 195.0 lb

## 2021-03-10 DIAGNOSIS — Z7689 Persons encountering health services in other specified circumstances: Secondary | ICD-10-CM | POA: Diagnosis not present

## 2021-03-10 DIAGNOSIS — F4323 Adjustment disorder with mixed anxiety and depressed mood: Secondary | ICD-10-CM

## 2021-03-10 MED ORDER — PHENTERMINE HCL 37.5 MG PO CAPS: 37.5000 mg | ORAL_CAPSULE | Freq: Every morning | ORAL | 1 refills | Status: DC

## 2021-03-10 NOTE — Assessment & Plan Note (Signed)
Visit #:3 Starting Weight:209 lb  Current weight:195 lb Previous weight:202 lb  Change in weight:down 7 lbs.  Goal weight:Ideal 165-175 lb.Goal 185 lb Dietary goals: work on getting in protein with each meal.has been doing well with this. Lean and green meals.  Exercise goals:stay active.add resistance bands to week. Continue walking a lot.  Medication:Increase phentermine to 37.5mg   Follow up :1 mo

## 2021-03-10 NOTE — Assessment & Plan Note (Signed)
Stable on current regimen.  Follow-up in 4 to 6 months.

## 2021-03-10 NOTE — Progress Notes (Signed)
Virtual Visit via Video Note  I connected with Priscilla Cook on 03/10/21 at  2:20 PM EDT by a video enabled telemedicine application and verified that I am speaking with the correct person using two identifiers.   I discussed the limitations of evaluation and management by telemedicine and the availability of in person appointments. The patient expressed understanding and agreed to proceed.  Patient location: at home Provider location: in office  Subjective:    CC: F/U Weight Mgt.    HPI:  F/U weight loss -overall she is actually doing really well we went up on the phentermine to 37.5 mg she is not had any chest pain shortness of breath or palpitations she feels like its been more consistently effective over the month even when she had her period.  Normally during that time she has a big shift and increase in appetite and actually feels like she did pretty well this time  Follow-up anxiety/depression-currently on citalopram.  Overall feels like she is doing really well.  She did have 1 weekend where there was something that happened with her son at school and really stressed her to the point that she said she feels like she had multiple panic attacks that weekend but says that that has not been her typical lately.    Past medical history, Surgical history, Family history not pertinant except as noted below, Social history, Allergies, and medications have been entered into the medical record, reviewed, and corrections made.    Objective:    General: Speaking clearly in complete sentences without any shortness of breath.  Alert and oriented x3.  Normal judgment. No apparent acute distress.    Impression and Recommendations:    Encounter for weight management Visit #: 3 Starting Weight: 209 lb   Current weight: 195 lb Previous weight:202 lb  Change in weight:down 7 lbs.  Goal weight: Ideal 165-175 lb.Goal 185 lb Dietary goals: work on getting in protein with each meal. has  been doing well with this. Lean and green meals.  Exercise goals: stay active. add resistance bands to week. Continue walking a lot.  Medication: Increase phentermine to 37.5mg   Follow up : 1 mo    No orders of the defined types were placed in this encounter.   Meds ordered this encounter  Medications   phentermine 37.5 MG capsule    Sig: Take 1 capsule (37.5 mg total) by mouth every morning.    Dispense:  30 capsule    Refill:  1     I discussed the assessment and treatment plan with the patient. The patient was provided an opportunity to ask questions and all were answered. The patient agreed with the plan and demonstrated an understanding of the instructions.   The patient was advised to call back or seek an in-person evaluation if the symptoms worsen or if the condition fails to improve as anticipated.   Nani Gasser, MD

## 2021-05-02 ENCOUNTER — Telehealth (INDEPENDENT_AMBULATORY_CARE_PROVIDER_SITE_OTHER): Payer: Managed Care, Other (non HMO) | Admitting: Physician Assistant

## 2021-05-02 ENCOUNTER — Encounter: Payer: Self-pay | Admitting: Physician Assistant

## 2021-05-02 VITALS — Ht 71.0 in | Wt 190.0 lb

## 2021-05-02 DIAGNOSIS — H8111 Benign paroxysmal vertigo, right ear: Secondary | ICD-10-CM | POA: Insufficient documentation

## 2021-05-02 DIAGNOSIS — L700 Acne vulgaris: Secondary | ICD-10-CM | POA: Diagnosis not present

## 2021-05-02 MED ORDER — SPIRONOLACTONE 25 MG PO TABS
25.0000 mg | ORAL_TABLET | Freq: Every day | ORAL | 0 refills | Status: DC
Start: 2021-05-02 — End: 2021-07-04

## 2021-05-02 NOTE — Progress Notes (Signed)
Dizziness x 2 weeks with nausea. Only 2 days without symptoms.

## 2021-05-02 NOTE — Progress Notes (Signed)
..  Virtual Visit via Video Note  I connected with Drinda Butts on 05/02/21 at  3:40 PM EDT by a video enabled telemedicine application and verified that I am speaking with the correct person using two identifiers.  Location: Patient: car Provider: clinic  .Marland KitchenParticipating in visit:  Patient: Priscilla Cook Provider: Tandy Gaw PA-C   I discussed the limitations of evaluation and management by telemedicine and the availability of in person appointments. The patient expressed understanding and agreed to proceed.  History of Present Illness: Pt is a 40 yo female with hx of BPPV who calls into the clinic with BPPV to the right symptoms for 3 weeks until 2 days ago was constant. Now symptoms are more intermittent but anytime she looks up and to the right or lays down. She does epley maneuver and usually helps a lot but not helping this time. She is very nauseated with the dizziness. Antivert helps a lot with symptoms. No fever, chills, sinus pressure, headache, ear pain, ST, cough, body aches.   She has ongoing chin and jaw line acne for several months. Worse around period. Tried abx creams and benzyol peroxide but just drys her skin out and worsens rosacea. She would like to try something elese.   .. Active Ambulatory Problems    Diagnosis Date Noted   Adjustment reaction with anxiety and depression 02/22/2009   Family history of gallbladder disease 05/09/2018   Encounter for weight management 01/03/2021   BPPV (benign paroxysmal positional vertigo), right 05/02/2021   Cystic acne 05/02/2021   Resolved Ambulatory Problems    Diagnosis Date Noted   Right upper quadrant abdominal pain 05/09/2018   Nausea 05/09/2018   No Additional Past Medical History      Observations/Objective: No acute distress No labored breathing No nystagmus observed today.  Scattered pustule around chin.   .. Today's Vitals   05/02/21 1523  Weight: 190 lb (86.2 kg)  Height: 5\' 11"  (1.803 m)   Body mass  index is 26.5 kg/m.     Assessment and Plan: Marland KitchenChakia was seen today for dizziness.  Diagnoses and all orders for this visit:  BPPV (benign paroxysmal positional vertigo), right -     Ambulatory referral to Physical Therapy  Cystic acne -     spironolactone (ALDACTONE) 25 MG tablet; Take 1 tablet (25 mg total) by mouth daily. For acne.  Hx of BPPV. 3 weeks of symptoms.  Has meclizine at home to use PRN.  No URI, sinus or ear pathology symptoms.  Failed home epley maneuvers.  Vestibular rehab ordered.   Discussed acne.  Seems like around her period.  Start spironolactone. Follow up with PCP.     Follow Up Instructions:    I discussed the assessment and treatment plan with the patient. The patient was provided an opportunity to ask questions and all were answered. The patient agreed with the plan and demonstrated an understanding of the instructions.   The patient was advised to call back or seek an in-person evaluation if the symptoms worsen or if the condition fails to improve as anticipated.    Morrie Sheldon, PA-C

## 2021-05-09 ENCOUNTER — Other Ambulatory Visit: Payer: Self-pay

## 2021-05-09 ENCOUNTER — Ambulatory Visit (INDEPENDENT_AMBULATORY_CARE_PROVIDER_SITE_OTHER): Payer: Managed Care, Other (non HMO) | Admitting: Physical Therapy

## 2021-05-09 ENCOUNTER — Encounter: Payer: Self-pay | Admitting: Physical Therapy

## 2021-05-09 DIAGNOSIS — R42 Dizziness and giddiness: Secondary | ICD-10-CM | POA: Diagnosis not present

## 2021-05-09 NOTE — Therapy (Addendum)
[facility] [address] [place], [place], [address] Phone: [phone]   Fax:  [phone]  Physical Therapy Evaluation and Discharge  Patient Details  Name: Priscilla Cook MRN: [id number] Date of Birth: 1981/04/13 Referring Provider (PT): Iran Planas   Encounter Date: 05/09/2021   PT End of Session - 05/09/21 0844     Visit Number 1    Number of Visits 6    Date for PT Re-Evaluation 06/20/21    PT Start Time 0800    PT Stop Time 0840    PT Time Calculation (min) 40 min    Activity Tolerance Patient tolerated treatment well    Behavior During Therapy [facility] for tasks assessed/performed             History reviewed. No pertinent past medical history.  Past Surgical History:  Procedure Laterality Date   BREAST SURGERY     augmentation    There were no vitals filed for this visit.    Subjective Assessment - 05/09/21 0806     Subjective Pt states that she gets "vertigo" once or twice a year and is able to self correct with the epley maneuver. She states that for the past month and a half she has been "dizzy". For a few days she was very nauseous but the symptoms are more controlled now. She still feels symptoms if she looks up and to the right. No symptoms at baseline. Pt is taking meclazine 3x a day    Patient Stated Goals decrease symptoms of dizziness    Currently in Pain? No/denies                [facility] PT Assessment - 05/09/21 0001       Assessment   Medical Diagnosis BPPV    Referring Provider (PT) Iran Planas    Onset Date/Surgical Date 03/22/21    Next MD Visit PRN      Precautions   Precautions None      Balance Screen   Has the patient fallen in the past 6 months No      Prior Function   Level of Independence Independent      Observation/Other Assessments   Focus on Therapeutic Outcomes (FOTO)  52                    Vestibular Assessment - 05/09/21 0001        Symptom Behavior   Type of Dizziness  Unsteady with head/body turns;Imbalance    Frequency of Dizziness constant    Duration of Dizziness constant    Symptom Nature Motion provoked    Aggravating Factors Looking up to the ceiling;Sitting with head tilted back;Turning head quickly;Rolling to right    Relieving Factors Medication    Progression of Symptoms Better      Oculomotor Exam   Oculomotor Alignment Normal    Ocular ROM normal    Spontaneous Absent    Gaze-induced  Absent    Head shaking Horizontal Absent    Head Shaking Vertical Absent    Smooth Pursuits Intact    Saccades Intact      Oculomotor Exam-Fixation Suppressed    Left Head Impulse normal    Right Head Impulse normal      Vestibulo-Ocular Reflex   VOR 1 Head Only (x 1 viewing) no symptoms      Positional Testing   Dix-Hallpike Dix-Hallpike Right;Dix-Hallpike Left    Horizontal Canal Testing Horizontal Canal Right;Horizontal Canal Left  Dix-Hallpike Right   Dix-Hallpike Right Duration 30 seconds    Dix-Hallpike Right Symptoms Upbeat, right rotatory nystagmus      Dix-Hallpike Left   Dix-Hallpike Left Duration none    Dix-Hallpike Left Symptoms No nystagmus      Horizontal Canal Right   Horizontal Canal Right Duration 5 seconds    Horizontal Canal Right Symptoms --   no nystagmus, c/o "dizzy"     Horizontal Canal Left   Horizontal Canal Left Duration none    Horizontal Canal Left Symptoms Normal      Positional Sensitivities   Up from Right Hallpike Mild dizziness    Rolling Right Mild dizziness                Objective measurements completed on examination: See above findings.        Vestibular Treatment/Exercise - 05/09/21 0001       Vestibular Treatment/Exercise   Vestibular Treatment Provided Canalith Repositioning    Canalith Repositioning Epley Manuever Right       EPLEY MANUEVER RIGHT   Number of Reps  2    Overall Response Improved Symptoms                     PT Education - 05/09/21 0844     Education Details pt educated on brandt daroff for HEP, PT goals and POC    Person(s) Educated Patient    Methods Explanation;Demonstration    Comprehension Verbalized understanding;Returned demonstration                 PT Long Term Goals - 05/09/21 1015       PT LONG TERM GOAL #1   Title Pt will be independent with HEP to self manage BPPV symptoms    Time 6    Period Weeks    Status New    Target Date 06/20/21      PT LONG TERM GOAL #2   Title Pt will improve FOTO to >= 62 to demo improved functional mobility    Time 6    Period Weeks    Status New    Target Date 06/20/21                    Plan - 05/09/21 0845     Clinical Impression Statement Pt is a 40 y/o female who is referred for BPPV. Pt with positive Rt Dix Halpike test and symptoms are lessened by Epley maneuver. Pt will benefit from skilled PT to treat if symptoms recur and for education and self treatment at home.    Personal Factors and Comorbidities Time since onset of injury/illness/exacerbation;Past/Current Experience    Examination-Activity Limitations [place]    Stability/Clinical Decision Making Evolving/Moderate complexity    Clinical Decision Making Moderate    Rehab Potential Good    PT Frequency 1x / week    PT Duration 6 weeks    PT Treatment/Interventions Canalith Repostioning;Neuromuscular re-education;Vestibular    PT Next Visit Plan assess BPPV as needed    PT Home Exercise Plan brandt daroff    Consulted and Agree with Plan of Care Patient             Patient will benefit from skilled therapeutic intervention in order to improve the following deficits and impairments:  Dizziness  Visit Diagnosis: Dizziness and giddiness - Plan: PT plan of care cert/re-cert     Problem List Patient Active Problem List   Diagnosis Date Noted   BPPV (benign  paroxysmal positional vertigo), right 05/02/2021   Cystic acne 05/02/2021    Encounter for weight management 01/03/2021   Family history of gallbladder disease 05/09/2018   Adjustment reaction with anxiety and depression 02/22/2009  PHYSICAL THERAPY DISCHARGE SUMMARY  Visits from Start of Care: 1  Current functional level related to goals / functional outcomes: Decreased symptoms    Remaining deficits: None    Education / Equipment: HEP   Patient agrees to discharge. Patient goals were partially met. Patient is being discharged due to being pleased with the current functional level. Isabelle Course, PT,DPT11/29/2210:39 AM   Isabelle Course, PT 05/09/2021, 10:19 AM  [facility] [place] [place] [place], [place], [address] Phone: [phone]   Fax:  [phone]  Name: Priscilla Cook MRN: [id number] Date of Birth: 16-Jul-1980

## 2021-06-05 ENCOUNTER — Other Ambulatory Visit: Payer: Self-pay | Admitting: Family Medicine

## 2021-06-05 ENCOUNTER — Other Ambulatory Visit: Payer: Self-pay | Admitting: Physician Assistant

## 2021-06-05 ENCOUNTER — Encounter: Payer: Self-pay | Admitting: Family Medicine

## 2021-06-05 ENCOUNTER — Other Ambulatory Visit: Payer: Self-pay

## 2021-06-05 ENCOUNTER — Ambulatory Visit (INDEPENDENT_AMBULATORY_CARE_PROVIDER_SITE_OTHER): Payer: Managed Care, Other (non HMO) | Admitting: Family Medicine

## 2021-06-05 VITALS — BP 103/66 | HR 87 | Ht 71.0 in | Wt 189.0 lb

## 2021-06-05 DIAGNOSIS — G4700 Insomnia, unspecified: Secondary | ICD-10-CM

## 2021-06-05 DIAGNOSIS — Z23 Encounter for immunization: Secondary | ICD-10-CM

## 2021-06-05 DIAGNOSIS — Z7689 Persons encountering health services in other specified circumstances: Secondary | ICD-10-CM | POA: Diagnosis not present

## 2021-06-05 DIAGNOSIS — L719 Rosacea, unspecified: Secondary | ICD-10-CM

## 2021-06-05 DIAGNOSIS — L700 Acne vulgaris: Secondary | ICD-10-CM

## 2021-06-05 DIAGNOSIS — L853 Xerosis cutis: Secondary | ICD-10-CM

## 2021-06-05 DIAGNOSIS — F4323 Adjustment disorder with mixed anxiety and depressed mood: Secondary | ICD-10-CM

## 2021-06-05 LAB — TSH: TSH: 3.59 mIU/L

## 2021-06-05 MED ORDER — DAYVIGO 5 MG PO TABS: 1.0000 | ORAL_TABLET | Freq: Every day | ORAL | 0 refills | Status: DC

## 2021-06-05 MED ORDER — METRONIDAZOLE 1 % EX GEL
Freq: Every day | CUTANEOUS | 3 refills | Status: DC
Start: 2021-06-05 — End: 2024-05-26

## 2021-06-05 MED ORDER — PHENTERMINE HCL 37.5 MG PO CAPS: 37.5000 mg | ORAL_CAPSULE | Freq: Every morning | ORAL | 1 refills | Status: DC

## 2021-06-05 MED ORDER — SERTRALINE HCL 50 MG PO TABS: ORAL_TABLET | ORAL | 1 refills | Status: DC

## 2021-06-05 NOTE — Assessment & Plan Note (Signed)
Visit #:4 Starting Weight:209lb  Current weight:189lb Previous weight:190 lb Change in weight:down 1 lbs. Goal weight:Ideal 165-175 lb.Goal 185 lb Dietary goals: work on getting in protein with each meal.has been doing well with this. Lean and green meals.  Exercise goals:stay active.add resistance bands to week. Continue walking a lot.  Medication: continuephentermine to 37.5mg , if still not losing weight after 1 more month then consider starting a taper. Follow up:1 mo

## 2021-06-05 NOTE — Patient Instructions (Addendum)
Cut trazodone in half and take half a tab nightly for 3 days and then stop.  Then can start the new sleep medication   Cut citalopram in half and take a half a tab for 8 days and then stop and start the new medication called sertraline.

## 2021-06-05 NOTE — Progress Notes (Signed)
Established Patient Office Visit  Subjective:  Patient ID: Priscilla Cook, female    DOB: 07-Jun-1981  Age: 40 y.o. MRN: [id number]  CC:  Chief Complaint  Patient presents with   Weight Check    HPI [name] presents for   Follow-up weight management.  Currently on phentermine 37.5 mg she has been on it for about 8 weeks at this point.  She felt like she did well initially but this last month she feels like her weight has stayed about the same.  She still try to eat healthy and staying active.  She is also concerned about her face she says about 2 weeks out of the month usually around her menstrual cycle she has significant breakout mostly around her mouth and chin area and a little bit in between the eyebrows.  She started spironolactone about 4 weeks ago and feels like it helped maybe a little bit.  Has a history of rosacea.  He also just feels emotionally very stressed.  She feels like the citalopram may not be working as well as it was at 1 point.  She is just feeling a little bit overwhelmed with trying to take care of her kids and family and trying to find time for herself which is rare not being able to really exercise and take care of her body like she would like to.  Just feels her anger and irritability increasing.  Also in the last week or so she started waking up right around 1230 midnight and then cannot go back to sleep she is able to fall asleep but not stay asleep and she already takes 150 mg of trazodone every night in addition to melatonin, 10 mg and magnesium.  No past medical history on file.  Past Surgical History:  Procedure Laterality Date   BREAST SURGERY     augmentation    Family History  Problem Relation Age of Onset   Heart attack Other        grandparent   Cancer Other        grandparent   Depression Other        grandparent   Diabetes Father     Social History   Socioeconomic History   Marital status: Married    Spouse name: Not on  file   Number of children: Not on file   Years of education: Not on file   Highest education level: Not on file  Occupational History   Not on file  Tobacco Use   Smoking status: Former    Types: Cigarettes    Quit date: 2017    Years since quitting: 5.9   Smokeless tobacco: Never  Substance and Sexual Activity   Alcohol use: Yes   Drug use: No   Sexual activity: Not on file  Other Topics Concern   Not on file  Social History Narrative   Not on file   Social Determinants of Health   Financial Resource Strain: Not on file  Food Insecurity: Not on file  Transportation Needs: Not on file  Physical Activity: Not on file  Stress: Not on file  Social Connections: Not on file  Intimate Partner Violence: Not on file    Outpatient Medications Prior to Visit  Medication Sig Dispense Refill   citalopram (CELEXA) 20 MG tablet Take 1 tablet (20 mg total) by mouth daily. 90 tablet 1   etonogestrel-ethinyl estradiol (NUVARING) 0.12-0.015 MG/24HR vaginal ring Place vaginally.     spironolactone (ALDACTONE) 25  MG tablet Take 1 tablet (25 mg total) by mouth daily. For acne. 90 tablet 0   traZODone (DESYREL) 150 MG tablet Take 1 tablet (150 mg total) by mouth at bedtime as needed for sleep. 90 tablet 1   phentermine 37.5 MG capsule Take 1 capsule (37.5 mg total) by mouth every morning. 30 capsule 1   No facility-administered medications prior to visit.    No Known Allergies  ROS Review of Systems    Objective:    Physical Exam Vitals reviewed.  Constitutional:      Appearance: She is well-developed.  HENT:     Head: Normocephalic and atraumatic.  Eyes:     Conjunctiva/sclera: Conjunctivae normal.  Cardiovascular:     Rate and Rhythm: Normal rate.  Pulmonary:     Effort: Pulmonary effort is normal.  Skin:    General: Skin is dry.  Neurological:     Mental Status: She is alert and oriented to person, place, and time.  Psychiatric:        Behavior: Behavior normal.     BP 103/66   Pulse 87   Ht 5\' 11"  (1.803 m)   Wt 189 lb (85.7 kg)   SpO2 100%   BMI 26.36 kg/m  Wt Readings from Last 3 Encounters:  06/05/21 189 lb (85.7 kg)  05/02/21 190 lb (86.2 kg)  03/10/21 195 lb (88.5 kg)     Health Maintenance Due  Topic Date Due   HIV Screening  Never done   Hepatitis C Screening  Never done   COVID-19 Vaccine (4 - Booster for Pfizer series) 07/20/2020    There are no preventive care reminders to display for this patient.  Lab Results  Component Value Date   TSH 3.59 06/05/2021   Lab Results  Component Value Date   WBC 9.1 07/18/2020   HGB 14.1 07/18/2020   HCT 41.8 07/18/2020   MCV 92.3 07/18/2020   PLT 320 07/18/2020   Lab Results  Component Value Date   NA 136 07/18/2020   K 4.7 07/18/2020   CO2 25 07/18/2020   GLUCOSE 86 07/18/2020   BUN 13 07/18/2020   CREATININE 0.89 07/18/2020   BILITOT 0.5 07/18/2020   ALKPHOS 31 (L) 06/25/2013   AST 17 07/18/2020   ALT 20 07/18/2020   PROT 7.6 07/18/2020   ALBUMIN 4.4 06/25/2013   CALCIUM 10.3 (H) 07/18/2020   No results found for: CHOL No results found for: HDL No results found for: LDLCALC No results found for: TRIG No results found for: CHOLHDL Lab Results  Component Value Date   HGBA1C 5.1 02/15/2011      Assessment & Plan:   Problem List Items Addressed This Visit       Musculoskeletal and Integument   Rosacea    Try topical metronidazole gel did warn that it is drying and so to use a moisturizer that does not have a lot of other agents such as acids or retinoids at least for the time being.  Due to the dryness and flaking that she is also been having difficulty with.      Relevant Medications   metroNIDAZOLE (METROGEL) 1 % gel   Cystic acne    Continue spironolactone for now since she has noticed some improvement over the last month.  And will add metronidazole gel which could also help with her rosacea.      Relevant Medications   metroNIDAZOLE (METROGEL) 1  % gel     Other   Insomnia  Unfortunately not responding well with 250 mg of trazodone does work in a taper that off and try [name] instead.  Belsomra was not covered under her insurance plan.  And if that is not helpful then we can consider Ambien or Lunesta.      Relevant Medications   Lemborexant ([name]) 5 MG TABS   Encounter for weight management    Visit #: 4 Starting Weight: 209 lb   Current weight: 189 lb Previous weight:190 lb  Change in weight:down 1 lbs.  Goal weight: Ideal 165-175 lb.Goal 185 lb Dietary goals: work on getting in protein with each meal. has been doing well with this. Lean and green meals.  Exercise goals: stay active. add resistance bands to week. Continue walking a lot.  Medication: continue phentermine to 37.5mg , if still not losing weight after 1 more month then consider starting a taper. Follow up : 1 mo       Adjustment reaction with anxiety and depression    Discussed options.  We will taper off the citalopram and switch to sertraline.  Like to see her back in about 4 to 6 weeks.      Relevant Medications   sertraline (ZOLOFT) 50 MG tablet   Other Visit Diagnoses     Need for immunization against influenza    -  Primary   Relevant Orders   Flu Vaccine QUAD 66mo+IM (Fluarix, Fluzone & Alfiuria Quad PF) (Completed)   Dry skin       Relevant Orders   TSH (Completed)       Excessively dry skin-we will check thyroid level.  Meds ordered this encounter  Medications   phentermine 37.5 MG capsule    Sig: Take 1 capsule (37.5 mg total) by mouth every morning.    Dispense:  30 capsule    Refill:  1   metroNIDAZOLE (METROGEL) 1 % gel    Sig: Apply topically daily.    Dispense:  60 g    Refill:  3   Lemborexant ([name]) 5 MG TABS    Sig: Take 1 tablet by mouth at bedtime.    Dispense:  30 tablet    Refill:  0   sertraline (ZOLOFT) 50 MG tablet    Sig: Take 0.5 tablets (25 mg total) by mouth daily for 8 days, THEN 1 tablet (50 mg  total) daily for 22 days.    Dispense:  30 tablet    Refill:  1    Follow-up: Return in about 4 weeks (around 07/03/2021) for new sleep med and weight check .    Nani Gasser, MD

## 2021-06-05 NOTE — Assessment & Plan Note (Addendum)
Continue spironolactone for now since she has noticed some improvement over the last month.  And will add metronidazole gel which could also help with her rosacea.

## 2021-06-06 DIAGNOSIS — G4700 Insomnia, unspecified: Secondary | ICD-10-CM | POA: Insufficient documentation

## 2021-06-06 NOTE — Progress Notes (Signed)
Your lab work is within acceptable range and there are no concerning findings.   ?

## 2021-06-06 NOTE — Assessment & Plan Note (Signed)
Unfortunately not responding well with 250 mg of trazodone does work in a taper that off and try [name] instead.  Belsomra was not covered under her insurance plan.  And if that is not helpful then we can consider Ambien or Lunesta.

## 2021-06-06 NOTE — Assessment & Plan Note (Signed)
Discussed options.  We will taper off the citalopram and switch to sertraline.  Like to see her back in about 4 to 6 weeks.

## 2021-06-06 NOTE — Assessment & Plan Note (Signed)
Try topical metronidazole gel did warn that it is drying and so to use a moisturizer that does not have a lot of other agents such as acids or retinoids at least for the time being.  Due to the dryness and flaking that she is also been having difficulty with.

## 2021-06-07 ENCOUNTER — Telehealth: Payer: Self-pay

## 2021-06-07 NOTE — Telephone Encounter (Signed)
Medication: Lemborexant ([name]) 5 MG TABS Prior authorization submitted via CoverMyMeds on 06/07/2021 PA submission pending

## 2021-06-07 NOTE — Telephone Encounter (Signed)
Medication: Lemborexant ([name]) 5 MG TABS Prior authorization determination received Medication has been approved Approval dates: 06/07/2021-07/08/2098  Patient aware via: MyChart Pharmacy aware: Yes Provider aware via this encounter

## 2021-07-03 ENCOUNTER — Other Ambulatory Visit: Payer: Self-pay | Admitting: Family Medicine

## 2021-07-03 ENCOUNTER — Other Ambulatory Visit: Payer: Self-pay | Admitting: Physician Assistant

## 2021-07-03 DIAGNOSIS — G4700 Insomnia, unspecified: Secondary | ICD-10-CM

## 2021-07-03 DIAGNOSIS — L700 Acne vulgaris: Secondary | ICD-10-CM

## 2021-07-04 ENCOUNTER — Other Ambulatory Visit: Payer: Self-pay

## 2021-07-04 ENCOUNTER — Ambulatory Visit (INDEPENDENT_AMBULATORY_CARE_PROVIDER_SITE_OTHER): Payer: Managed Care, Other (non HMO) | Admitting: Family Medicine

## 2021-07-04 ENCOUNTER — Encounter: Payer: Self-pay | Admitting: Family Medicine

## 2021-07-04 VITALS — BP 116/80 | HR 88 | Temp 98.2°F | Resp 18 | Ht 71.0 in | Wt 187.0 lb

## 2021-07-04 DIAGNOSIS — N3000 Acute cystitis without hematuria: Secondary | ICD-10-CM | POA: Diagnosis not present

## 2021-07-04 DIAGNOSIS — G4700 Insomnia, unspecified: Secondary | ICD-10-CM | POA: Diagnosis not present

## 2021-07-04 DIAGNOSIS — R8290 Unspecified abnormal findings in urine: Secondary | ICD-10-CM

## 2021-07-04 DIAGNOSIS — Z7689 Persons encountering health services in other specified circumstances: Secondary | ICD-10-CM

## 2021-07-04 DIAGNOSIS — L719 Rosacea, unspecified: Secondary | ICD-10-CM

## 2021-07-04 DIAGNOSIS — F4323 Adjustment disorder with mixed anxiety and depressed mood: Secondary | ICD-10-CM

## 2021-07-04 LAB — POCT URINALYSIS DIP (CLINITEK)
Bilirubin, UA: NEGATIVE
Glucose, UA: NEGATIVE mg/dL
Ketones, POC UA: NEGATIVE mg/dL
Nitrite, UA: NEGATIVE
POC PROTEIN,UA: 30 — AB
Spec Grav, UA: 1.01 (ref 1.010–1.025)
Urobilinogen, UA: 0.2 E.U./dL
pH, UA: 6.5 (ref 5.0–8.0)

## 2021-07-04 MED ORDER — SULFAMETHOXAZOLE-TRIMETHOPRIM 800-160 MG PO TABS: 1.0000 | ORAL_TABLET | Freq: Two times a day (BID) | ORAL | 0 refills | Status: DC

## 2021-07-04 NOTE — Assessment & Plan Note (Signed)
Does feel like overall her skin is improving and it does look better the metronidazole gel has been helpful in eliminating some of the keratolytics from her moisturizer.

## 2021-07-04 NOTE — Assessment & Plan Note (Signed)
Tried Trazodone. Now on [name].  She wants to see how it does over the next few weeks now that things are little bit more calm and see if it is low bit more helpful she has noticed some improvement on the medication she is not staying awake as long as she was previously when she does wake up.  We also discussed that the [name] does come in a 10 mg dose if we needed to try that.  She is can to try to back off on the Unisom as well.

## 2021-07-04 NOTE — Progress Notes (Signed)
Established Patient Office Visit  Subjective:  Patient ID: Priscilla Cook, female    DOB: 1980-07-26  Age: 40 y.o. MRN: [id number]  CC:  Chief Complaint  Patient presents with   Weight Check     Follow up    Anxiety    Depression follow. Patient states Zoloft is helping    Urinary Tract Infection    Low back pain/right side, urine odor, 2-3 days    Insomnia    Patient states [name] does not seem to be helping with sleep. Patient states she is adding Melatonin and Unisom to help.     HPI ALLERIA BEERMAN presents for   F/U Insomnia - Patient states [name] does not seem to be helping with sleep. Patient states she is adding Melatonin and Unisom to help.  She also feels like there is been a lot of stress and feeling on the go around the holidays so she is curious to see how the medication will do now that the holidays are over and things are little bit more low-key.  F/U Weight  Management -feels like she is actually doing well on the phentermine.  She is down another 2 pounds.  She feels like it was really helpful through the holidays to help her portion control and would like to stay on it for now.  F/U GAD -she is doing okay overall.  She feels like she is happy with the sertraline and feels like it is helpful and effective.  She does not want to make any changes to her dosing regimen.  C/O - Low back pain/right side, urine odor, 2-3 days.  No fevers chills or sweats.  No dysuria.   No past medical history on file.  Past Surgical History:  Procedure Laterality Date   BREAST SURGERY     augmentation    Family History  Problem Relation Age of Onset   Heart attack Other        grandparent   Cancer Other        grandparent   Depression Other        grandparent   Diabetes Father     Social History   Socioeconomic History   Marital status: Married    Spouse name: Not on file   Number of children: Not on file   Years of education: Not on file   Highest education  level: Not on file  Occupational History   Not on file  Tobacco Use   Smoking status: Former    Types: Cigarettes    Quit date: 2017    Years since quitting: 5.9   Smokeless tobacco: Never  Substance and Sexual Activity   Alcohol use: Yes   Drug use: No   Sexual activity: Not on file  Other Topics Concern   Not on file  Social History Narrative   Not on file   Social Determinants of Health   Financial Resource Strain: Not on file  Food Insecurity: Not on file  Transportation Needs: Not on file  Physical Activity: Not on file  Stress: Not on file  Social Connections: Not on file  Intimate Partner Violence: Not on file    Outpatient Medications Prior to Visit  Medication Sig Dispense Refill   doxylamine, Sleep, (UNISOM) 25 MG tablet Take 25 mg by mouth at bedtime as needed.     etonogestrel-ethinyl estradiol (NUVARING) 0.12-0.015 MG/24HR vaginal ring Place vaginally.     Lemborexant ([name]) 5 MG TABS Take 1 tablet by mouth  at bedtime. 30 tablet 0   melatonin 1 MG TABS tablet Take 1 mg by mouth at bedtime.     metroNIDAZOLE (METROGEL) 1 % gel Apply topically daily. 60 g 3   phentermine 37.5 MG capsule Take 1 capsule (37.5 mg total) by mouth every morning. 30 capsule 1   sertraline (ZOLOFT) 50 MG tablet Take 0.5 tablets (25 mg total) by mouth daily for 8 days, THEN 1 tablet (50 mg total) daily for 22 days. 30 tablet 1   spironolactone (ALDACTONE) 25 MG tablet Take 1 tablet (25 mg total) by mouth daily. For acne. 90 tablet 0   citalopram (CELEXA) 20 MG tablet Take 1 tablet (20 mg total) by mouth daily. 90 tablet 1   traZODone (DESYREL) 150 MG tablet Take 1 tablet (150 mg total) by mouth at bedtime as needed for sleep. 90 tablet 1   No facility-administered medications prior to visit.    No Known Allergies  ROS Review of Systems    Objective:    Physical Exam Constitutional:      Appearance: Normal appearance. She is well-developed.  HENT:     Head: Normocephalic  and atraumatic.  Cardiovascular:     Rate and Rhythm: Normal rate and regular rhythm.     Heart sounds: Normal heart sounds.  Pulmonary:     Effort: Pulmonary effort is normal.     Breath sounds: Normal breath sounds.  Musculoskeletal:     Comments: Mildlly tender over the right low back.  No CVA tenderness.  Skin:    General: Skin is warm and dry.  Neurological:     Mental Status: She is alert and oriented to person, place, and time.  Psychiatric:        Behavior: Behavior normal.   BP 116/80    Pulse 88    Temp 98.2 F (36.8 C)    Resp 18    Ht 5\' 11"  (1.803 m)    Wt 187 lb (84.8 kg)    LMP 06/06/2021    SpO2 96%    BMI 26.08 kg/m  Wt Readings from Last 3 Encounters:  07/04/21 187 lb (84.8 kg)  06/05/21 189 lb (85.7 kg)  05/02/21 190 lb (86.2 kg)     Health Maintenance Due  Topic Date Due   HIV Screening  Never done   Hepatitis C Screening  Never done   COVID-19 Vaccine (4 - Booster for Pfizer series) 07/20/2020    There are no preventive care reminders to display for this patient.  Lab Results  Component Value Date   TSH 3.59 06/05/2021   Lab Results  Component Value Date   WBC 9.1 07/18/2020   HGB 14.1 07/18/2020   HCT 41.8 07/18/2020   MCV 92.3 07/18/2020   PLT 320 07/18/2020   Lab Results  Component Value Date   NA 136 07/18/2020   K 4.7 07/18/2020   CO2 25 07/18/2020   GLUCOSE 86 07/18/2020   BUN 13 07/18/2020   CREATININE 0.89 07/18/2020   BILITOT 0.5 07/18/2020   ALKPHOS 31 (L) 06/25/2013   AST 17 07/18/2020   ALT 20 07/18/2020   PROT 7.6 07/18/2020   ALBUMIN 4.4 06/25/2013   CALCIUM 10.3 (H) 07/18/2020   No results found for: CHOL No results found for: HDL No results found for: LDLCALC No results found for: TRIG No results found for: [facility] Lab Results  Component Value Date   HGBA1C 5.1 02/15/2011      Assessment & Plan:  Problem List Items Addressed This Visit       Musculoskeletal and Integument   Rosacea    Does feel like  overall her skin is improving and it does look better the metronidazole gel has been helpful in eliminating some of the keratolytics from her moisturizer.        Other   Insomnia - Primary    Tried Trazodone. Now on [name].  She wants to see how it does over the next few weeks now that things are little bit more calm and see if it is low bit more helpful she has noticed some improvement on the medication she is not staying awake as long as she was previously when she does wake up.  We also discussed that the [name] does come in a 10 mg dose if we needed to try that.  She is can to try to back off on the Unisom as well.      Encounter for weight management    Visit #: 5 Starting Weight: 209 lb   Current weight: 187 lb Previous weight:189 lb  Change in weight:down 2 lbs.  Goal weight: Ideal 165-175 lb.Goal 185 lb Dietary goals: work on getting in protein with each meal. has been doing well with this. Lean and green meals.  Exercise goals: stay active. add resistance bands to week. Continue walking a lot.  Medication: continue phentermine to 37.5mg  Follow up : 2 mo       Adjustment reaction with anxiety and depression    Well on current regimen.      Other Visit Diagnoses     Bad odor of urine       Relevant Orders   POCT URINALYSIS DIP (CLINITEK) (Completed)   Acute cystitis without hematuria           Urinary tract infection-we will go ahead and treat with Bactrim DS.  If symptoms not resolving then please let us know and we can always send a culture.  Meds ordered this encounter  Medications   sulfamethoxazole-trimethoprim (BACTRIM DS) 800-160 MG tablet    Sig: Take 1 tablet by mouth 2 (two) times daily.    Dispense:  6 tablet    Refill:  0    Follow-up: Return in about 2 months (around 09/04/2021) for weight management .    Nani Gasser, MD

## 2021-07-04 NOTE — Assessment & Plan Note (Signed)
Well on current regimen. 

## 2021-07-04 NOTE — Assessment & Plan Note (Addendum)
Visit #:5 Starting Weight:209lb  Current weight:187lb Previous weight:189lb Change in weight:down 2 lbs. Goal weight:Ideal 165-175 lb.Goal 185 lb Dietary goals: work on getting in protein with each meal.has been doing well with this. Lean and green meals. Exercise goals:stay active.add resistance bands to week.Continue walking a lot. Medication: continuephentermine to 37.5mg  Follow up:2 mo

## 2021-08-02 ENCOUNTER — Other Ambulatory Visit: Payer: Self-pay | Admitting: Family Medicine

## 2021-08-02 DIAGNOSIS — L700 Acne vulgaris: Secondary | ICD-10-CM

## 2021-08-02 DIAGNOSIS — F4323 Adjustment disorder with mixed anxiety and depressed mood: Secondary | ICD-10-CM

## 2021-08-03 NOTE — Telephone Encounter (Signed)
Phentermine last written 06/05/2021 #30 with 1 refill Last appt 07/04/2021  Due for follow up the end of February

## 2021-08-04 ENCOUNTER — Telehealth: Payer: Self-pay

## 2021-08-04 NOTE — Telephone Encounter (Signed)
Medication: [name] 5 MG TABS Prior authorization submitted via CoverMyMeds on 08/04/2021 PA submission pending

## 2021-08-19 ENCOUNTER — Encounter: Payer: Self-pay | Admitting: Family Medicine

## 2021-08-19 DIAGNOSIS — G4700 Insomnia, unspecified: Secondary | ICD-10-CM

## 2021-08-21 NOTE — Telephone Encounter (Signed)
Dr. Madilyn Fireman - here is a copy of the denial letter for patient.   Dear Priscilla Cook,  On behalf of [employer], [employer] is responsible for reviewing pharmacy services provided to [facility] members. We received a request from your prescriber for coverage of [name] Tab 5mg . We reviewed all of the information you and/or your doctor sent to [place] and sent the information to an appropriate physician specialist if needed. Unfortunately, we must deny coverage for [name].  Why was my request denied?  This request was denied because you did not meet the following clinical requirements:  Based on the information provided, you do not meet the established medication-specific criteria or guidelines for [name] at this time. The request for coverage for [name] Tab 5mg , use as directed (30 per month), is denied. This decision is based on health plan criteria for [name]. This medicine is covered only if: You have a history of trial and failure of at least two weeks, contraindication, or intolerance to two of the following sedative-hypnotic alternatives:  (1) Zolpidem (generic Ambien). (2) Zaleplon (generic Sonata). (3) Eszopiclone (generic Lunesta).  The information provided does not show that you meet the criteria listed above.

## 2021-08-21 NOTE — Telephone Encounter (Signed)
It looks like PA was submitted at the end of January.  Would you mind checking on this?  Thank you.

## 2021-08-22 MED ORDER — ZALEPLON 5 MG PO CAPS: 5.0000 mg | ORAL_CAPSULE | Freq: Every evening | ORAL | 0 refills | Status: DC | PRN

## 2021-08-22 NOTE — Addendum Note (Signed)
Addended by: Nani Gasser D on: 08/22/2021 08:42 AM   Modules accepted: Orders

## 2021-08-31 ENCOUNTER — Other Ambulatory Visit: Payer: Self-pay | Admitting: Family Medicine

## 2021-08-31 DIAGNOSIS — F4323 Adjustment disorder with mixed anxiety and depressed mood: Secondary | ICD-10-CM

## 2021-08-31 DIAGNOSIS — L700 Acne vulgaris: Secondary | ICD-10-CM

## 2021-09-01 NOTE — Telephone Encounter (Signed)
Phentermine last written 08/03/2021 #30 with no refills Last appt 07/04/2021, told to follow up in two months

## 2021-09-04 ENCOUNTER — Encounter: Payer: Self-pay | Admitting: Family Medicine

## 2021-09-04 ENCOUNTER — Other Ambulatory Visit: Payer: Self-pay

## 2021-09-04 ENCOUNTER — Ambulatory Visit: Payer: 59 | Admitting: Family Medicine

## 2021-09-04 VITALS — BP 119/77 | HR 78 | Resp 16 | Ht 71.0 in | Wt 188.0 lb

## 2021-09-04 DIAGNOSIS — L700 Acne vulgaris: Secondary | ICD-10-CM

## 2021-09-04 DIAGNOSIS — G4700 Insomnia, unspecified: Secondary | ICD-10-CM

## 2021-09-04 DIAGNOSIS — Z7689 Persons encountering health services in other specified circumstances: Secondary | ICD-10-CM | POA: Diagnosis not present

## 2021-09-04 MED ORDER — PHENTERMINE HCL 37.5 MG PO CAPS: 37.5000 mg | ORAL_CAPSULE | Freq: Every morning | ORAL | 1 refills | Status: DC

## 2021-09-04 MED ORDER — ZALEPLON 5 MG PO CAPS: 5.0000 mg | ORAL_CAPSULE | Freq: Every evening | ORAL | 0 refills | Status: DC | PRN

## 2021-09-04 NOTE — Assessment & Plan Note (Signed)
Visit #:6 Starting Weight:209lb  Current weight:188lb Previous weight:187lb Change in weight:up 1lbs. Goal weight:Ideal 165-175 lb.Goal 185 lb Dietary goals: work on getting in protein with each meal.has been doing well with this. Lean and green meals. Exercise goals:stay active.add resistance bands to week.Continue walking a lot. Medication:continuephentermine to 37.5mg  Follow up:2 mo

## 2021-09-04 NOTE — Assessment & Plan Note (Signed)
We will do a trial of Sonata first and if not successful after 2 weeks can do a trial of Lunesta.  If that is not helpful then we can reapply for authorization for the [facility].  She can just let us know if she has any side effects but if she tolerates that well then it should be fairly affordable.

## 2021-09-04 NOTE — Progress Notes (Signed)
Established Patient Office Visit  Subjective:  Patient ID: Priscilla Cook, female    DOB: Sep 01, 1980  Age: 41 y.o. MRN: [id number]  CC:  Chief Complaint  Patient presents with   Weight Management     Currently on Phentermine, previous weight 187, current weight today 188.    Insomnia    Currently back on Trazodone. Insurance denied [name].     HPI Priscilla Cook presents for   Currently back on Trazodone.  Says it is really not consistently helpful.  She says if nothing wakes her up she sleeps okay with it but if anything whether it is her kids are having to get up and urinate, then she just cannot go back to sleep.  Insurance denied [name].  She received a paper from [employer] saying that she must try either Ambien, Lunesta, or Sonata.  She really wants to stay away from Ambien if at all possible.  She really prefer the [name].  She did really well on it.  She woke up refreshed but did not feel overly sedated.  He really did not have any significant side effects  Weight management- Currently on Phentermine, previous weight 187, current weight today 188.  She feels like at home she is actually been doing really well.  She is getting ready to start her period so she feels a little bit more weight retention going on currently.  But says last week she felt great she been losing weight she felt really energetic.  But the biggest thing is she still struggling with her sleep and that makes it more difficult to want to exercise more consistently.   No past medical history on file.  Past Surgical History:  Procedure Laterality Date   BREAST SURGERY     augmentation    Family History  Problem Relation Age of Onset   Heart attack Other        grandparent   Cancer Other        grandparent   Depression Other        grandparent   Diabetes Father     Social History   Socioeconomic History   Marital status: Married    Spouse name: Not on file   Number of children:  Not on file   Years of education: Not on file   Highest education level: Not on file  Occupational History   Not on file  Tobacco Use   Smoking status: Former    Types: Cigarettes    Quit date: 2017    Years since quitting: 6.1   Smokeless tobacco: Never  Substance and Sexual Activity   Alcohol use: Yes   Drug use: No   Sexual activity: Not on file  Other Topics Concern   Not on file  Social History Narrative   Not on file   Social Determinants of Health   Financial Resource Strain: Not on file  Food Insecurity: Not on file  Transportation Needs: Not on file  Physical Activity: Not on file  Stress: Not on file  Social Connections: Not on file  Intimate Partner Violence: Not on file    Outpatient Medications Prior to Visit  Medication Sig Dispense Refill   doxylamine, Sleep, (UNISOM) 25 MG tablet Take 25 mg by mouth at bedtime as needed.     etonogestrel-ethinyl estradiol (NUVARING) 0.12-0.015 MG/24HR vaginal ring Place vaginally.     melatonin 1 MG TABS tablet Take 1 mg by mouth at bedtime.  metroNIDAZOLE (METROGEL) 1 % gel Apply topically daily. 60 g 3   sertraline (ZOLOFT) 50 MG tablet Take 1 tablet (50 mg total) by mouth daily. 90 tablet 0   spironolactone (ALDACTONE) 25 MG tablet TAKE ONE TABLET BY MOUTH ONE TIME DAILY FOR ACNE 90 tablet 0   traZODone (DESYREL) 150 MG tablet Take 150 mg by mouth at bedtime as needed.     phentermine 37.5 MG capsule TAKE ONE CAPSULE BY MOUTH EVERY MORNING 30 capsule 0   [name] 5 MG TABS TAKE ONE TABLET BY MOUTH AT BEDTIME 30 tablet 2   sulfamethoxazole-trimethoprim (BACTRIM DS) 800-160 MG tablet Take 1 tablet by mouth 2 (two) times daily. 6 tablet 0   zaleplon (SONATA) 5 MG capsule Take 1 capsule (5 mg total) by mouth at bedtime as needed for sleep. (Patient not taking: Reported on 09/04/2021) 30 capsule 0   No facility-administered medications prior to visit.    No Known Allergies  ROS Review of Systems    Objective:     Physical Exam Constitutional:      Appearance: Normal appearance. She is well-developed.  HENT:     Head: Normocephalic and atraumatic.  Cardiovascular:     Rate and Rhythm: Normal rate and regular rhythm.     Heart sounds: Normal heart sounds.  Pulmonary:     Effort: Pulmonary effort is normal.     Breath sounds: Normal breath sounds.  Skin:    General: Skin is warm and dry.  Neurological:     Mental Status: She is alert and oriented to person, place, and time.  Psychiatric:        Behavior: Behavior normal.    BP 119/77    Pulse 78    Resp 16    Ht 5\' 11"  (1.803 m)    Wt 188 lb (85.3 kg)    SpO2 100%    BMI 26.22 kg/m  Wt Readings from Last 3 Encounters:  09/04/21 188 lb (85.3 kg)  07/04/21 187 lb (84.8 kg)  06/05/21 189 lb (85.7 kg)     Health Maintenance Due  Topic Date Due   HIV Screening  Never done   Hepatitis C Screening  Never done    There are no preventive care reminders to display for this patient.  Lab Results  Component Value Date   TSH 3.59 06/05/2021   Lab Results  Component Value Date   WBC 9.1 07/18/2020   HGB 14.1 07/18/2020   HCT 41.8 07/18/2020   MCV 92.3 07/18/2020   PLT 320 07/18/2020   Lab Results  Component Value Date   NA 136 07/18/2020   K 4.7 07/18/2020   CO2 25 07/18/2020   GLUCOSE 86 07/18/2020   BUN 13 07/18/2020   CREATININE 0.89 07/18/2020   BILITOT 0.5 07/18/2020   ALKPHOS 31 (L) 06/25/2013   AST 17 07/18/2020   ALT 20 07/18/2020   PROT 7.6 07/18/2020   ALBUMIN 4.4 06/25/2013   CALCIUM 10.3 (H) 07/18/2020   No results found for: CHOL No results found for: HDL No results found for: LDLCALC No results found for: TRIG No results found for: CHOLHDL Lab Results  Component Value Date   HGBA1C 5.1 02/15/2011      Assessment & Plan:   Problem List Items Addressed This Visit       Musculoskeletal and Integument   Cystic acne    She feels like the spironolactone has been extraordinarily helpful for the acne.   She says occasionally she  will just get a few bumps that will pop up.  Most the time they seem to be stress-induced but she really is very happy with her current regimen.        Other   Insomnia - Primary    We will do a trial of Sonata first and if not successful after 2 weeks can do a trial of Lunesta.  If that is not helpful then we can reapply for authorization for the [facility].  She can just let us know if she has any side effects but if she tolerates that well then it should be fairly affordable.      Encounter for weight management    Visit #: 6 Starting Weight: 209 lb   Current weight: 188 lb Previous weight:187 lb  Change in weight:up 1 lbs.  Goal weight: Ideal 165-175 lb.Goal 185 lb Dietary goals: work on getting in protein with each meal. has been doing well with this. Lean and green meals.  Exercise goals: stay active. add resistance bands to week. Continue walking a lot.  Medication: continue phentermine to 37.5mg  Follow up : 2 mo        Meds ordered this encounter  Medications   phentermine 37.5 MG capsule    Sig: Take 1 capsule (37.5 mg total) by mouth every morning.    Dispense:  30 capsule    Refill:  1   zaleplon (SONATA) 5 MG capsule    Sig: Take 1 capsule (5 mg total) by mouth at bedtime as needed for sleep.    Dispense:  30 capsule    Refill:  0    Follow-up: Return in about 2 months (around 11/02/2021) for weight management .    Beatrice Lecher, MD

## 2021-09-04 NOTE — Assessment & Plan Note (Signed)
She feels like the spironolactone has been extraordinarily helpful for the acne.  She says occasionally she will just get a few bumps that will pop up.  Most the time they seem to be stress-induced but she really is very happy with her current regimen.

## 2021-09-18 MED ORDER — ESZOPICLONE 2 MG PO TABS: 2.0000 mg | ORAL_TABLET | Freq: Every evening | ORAL | 0 refills | Status: DC | PRN

## 2021-09-18 NOTE — Telephone Encounter (Signed)
Meds ordered this encounter  ?Medications  ? DISCONTD: zaleplon (SONATA) 5 MG capsule  ?  Sig: Take 1 capsule (5 mg total) by mouth at bedtime as needed for sleep.  ?  Dispense:  30 capsule  ?  Refill:  0  ? eszopiclone (LUNESTA) 2 MG TABS tablet  ?  Sig: Take 1 tablet (2 mg total) by mouth at bedtime as needed for sleep. Take immediately before bedtime  ?  Dispense:  14 tablet  ?  Refill:  0  ? ? ?New rx for [facility] sent.  ?

## 2021-09-18 NOTE — Addendum Note (Signed)
Addended by: Beatrice Lecher D on: 09/18/2021 03:58 PM ? ? Modules accepted: Orders ? ?

## 2021-09-19 MED ORDER — ESZOPICLONE 2 MG PO TABS: 2.0000 mg | ORAL_TABLET | Freq: Every evening | ORAL | 0 refills | Status: DC | PRN

## 2021-09-19 NOTE — Telephone Encounter (Signed)
Meds ordered this encounter  ?Medications  ? DISCONTD: zaleplon (SONATA) 5 MG capsule  ?  Sig: Take 1 capsule (5 mg total) by mouth at bedtime as needed for sleep.  ?  Dispense:  30 capsule  ?  Refill:  0  ? DISCONTD: eszopiclone (LUNESTA) 2 MG TABS tablet  ?  Sig: Take 1 tablet (2 mg total) by mouth at bedtime as needed for sleep. Take immediately before bedtime  ?  Dispense:  14 tablet  ?  Refill:  0  ? eszopiclone (LUNESTA) 2 MG TABS tablet  ?  Sig: Take 1 tablet (2 mg total) by mouth at bedtime as needed for sleep. Take immediately before bedtime  ?  Dispense:  14 tablet  ?  Refill:  0  ? ? ?

## 2021-09-19 NOTE — Addendum Note (Signed)
Addended by: Nani Gasser D on: 09/19/2021 07:47 AM ? ? Modules accepted: Orders ? ?

## 2021-09-19 NOTE — Addendum Note (Signed)
Addended by: Chalmers Cater on: 09/19/2021 07:27 AM ? ? Modules accepted: Orders ? ?

## 2021-10-23 ENCOUNTER — Other Ambulatory Visit: Payer: Self-pay | Admitting: Family Medicine

## 2021-10-23 DIAGNOSIS — F4323 Adjustment disorder with mixed anxiety and depressed mood: Secondary | ICD-10-CM

## 2021-11-06 ENCOUNTER — Ambulatory Visit: Payer: 59 | Admitting: Family Medicine

## 2021-11-06 ENCOUNTER — Encounter: Payer: Self-pay | Admitting: Family Medicine

## 2021-11-06 VITALS — BP 124/69 | HR 101 | Ht 71.0 in | Wt 182.0 lb

## 2021-11-06 DIAGNOSIS — Z7689 Persons encountering health services in other specified circumstances: Secondary | ICD-10-CM | POA: Diagnosis not present

## 2021-11-06 DIAGNOSIS — G4700 Insomnia, unspecified: Secondary | ICD-10-CM | POA: Diagnosis not present

## 2021-11-06 MED ORDER — PHENTERMINE HCL 37.5 MG PO CAPS: 37.5000 mg | ORAL_CAPSULE | Freq: Every morning | ORAL | 1 refills | Status: DC

## 2021-11-06 MED ORDER — ESZOPICLONE 2 MG PO TABS: 2.0000 mg | ORAL_TABLET | Freq: Every evening | ORAL | 0 refills | Status: DC | PRN

## 2021-11-06 NOTE — Assessment & Plan Note (Addendum)
Visit #:?7 ?Starting Weight:?209?lb ?? ?Current weight:?182?lb ?Previous weight:188?lb? ?Change in weight: down 6?lbs.? ?Goal weight:?Ideal 165-175 lb. ?Dietary goals: work on getting in protein with each meal.?has been doing well with this. Lean and green meals.? ?Exercise goals:?Really encouraged her to try to set some small exercise goals even if it is just 15 or 20 minutes. ?Add resistance bands to week.?consider interval training if she doesn't have a lot of time.  Continue walking a lot.? ?Medication:?continue?phentermine to 37.37m ?Follow up?:?2?mo? ? ?He has officially met her initial weight loss goal of 185.  She says ultimately she like to get between 165 275 pounds.  Did discuss that eventually when she gets to her final goal we would start tapering the phentermine backwards so would not just be stopping it [place] that she could start to readjust.  Our goal is to start to make these healthy changes so that when she does come off of the medication she can continue with those changes. ?

## 2021-11-06 NOTE — Assessment & Plan Note (Signed)
Doing fair with trazodone for now but will resend the Lunesta to [facility] it never made it to the correct pharmacy.  If she does not do well with that medication we can always try to reapply for the [facility].  She is already also tried [employer]. ?

## 2021-11-06 NOTE — Progress Notes (Signed)
? ?Established Patient Office Visit ? ?Subjective   ?Patient ID: Priscilla Cook, female    DOB: 1980-09-13  Age: 41 y.o. MRN: [id number] ? ?Chief Complaint  ?Patient presents with  ? Weight Check  ? ? ?HPI ? ?Here for today for weight management.  She is doing well she is currently on phentermine 37.5 mg.  She is here for 8-week follow-up.  She is down an additional 6 pounds from when I last saw her.  She is doing well overall she still struggling with getting and more consistent exercise.  She has had a lot of sickness going around the house with the kids etc. NSAIDs been a little bit more challenging.  Feels like the medication has been really helpful in helping her feel more full more quickly which is in turn helping control portions.  ? ?Did want a let me know if she is sleeping a little better.  She is back on the trazodone for now.  She says for what ever reason it does seem a little better but she still felt the best on the [facility].  She felt refreshed.  Wears the trazodone still makes her feel little groggy in the mornings. The lunesta rx wasn't sent to public so wasn't able to get it filled.  Insurance is wanting her to try couple other options before they will pay for the [facility]. ? ? ? ?ROS ? ?  ?Objective:  ?  ? ?BP 124/69   Pulse (!) 101   Ht 5' 11"  (1.803 m)   Wt 182 lb (82.6 kg)   SpO2 100%   BMI 25.38 kg/m?  ? ? ?Physical Exam ?Vitals and nursing note reviewed.  ?Constitutional:   ?   Appearance: She is well-developed.  ?HENT:  ?   Head: Normocephalic and atraumatic.  ?Cardiovascular:  ?   Rate and Rhythm: Normal rate and regular rhythm.  ?   Heart sounds: Normal heart sounds.  ?Pulmonary:  ?   Effort: Pulmonary effort is normal.  ?   Breath sounds: Normal breath sounds.  ?Skin: ?   General: Skin is warm and dry.  ?Neurological:  ?   Mental Status: She is alert and oriented to person, place, and time.  ?Psychiatric:     ?   Behavior: Behavior normal.  ? ? ? ?No results found for any visits on  11/06/21. ? ? ? ?The ASCVD Risk score (Arnett DK, et al., 2019) failed to calculate for the following reasons: ?  The 2019 ASCVD risk score is only valid for ages 66 to 77 ? ?  ?Assessment & Plan:  ? ?Problem List Items Addressed This Visit   ? ?  ? Other  ? Insomnia  ?  Doing fair with trazodone for now but will resend the Lunesta to [facility] it never made it to the correct pharmacy.  If she does not do well with that medication we can always try to reapply for the [facility].  She is already also tried [employer]. ? ?  ?  ? Encounter for weight management - Primary  ?  Visit #: 7 ?Starting Weight: 209 lb ?  ?Current weight: 182 lb ?Previous weight:188 lb  ?Change in weight: down 6 lbs.  ?Goal weight: Ideal 165-175 lb. ?Dietary goals: work on getting in protein with each meal. has been doing well with this. Lean and green meals.  ?Exercise goals: Really encouraged her to try to set some small exercise goals even if it is just 15 or  20 minutes.  Add resistance bands to week. consider interval training if she doesn't have a lot of time.  Continue walking a lot.  ?Medication: continue phentermine to 37.81m ?Follow up : 2 mo  ? ?He has officially met her initial weight loss goal of 185.  She says ultimately she like to get between 165 275 pounds.  Did discuss that eventually when she gets to her final goal we would start tapering the phentermine backwards so would not just be stopping it [place] that she could start to readjust.  Our goal is to start to make these healthy changes so that when she does come off of the medication she can continue with those changes. ? ?  ?  ? ? ?Return in about 2 months (around 01/06/2022) for Weight Management.  .  ? ? ?CBeatrice Lecher MD ? ?

## 2021-11-20 ENCOUNTER — Encounter: Payer: Self-pay | Admitting: Family Medicine

## 2021-11-20 DIAGNOSIS — G4700 Insomnia, unspecified: Secondary | ICD-10-CM

## 2021-11-21 MED ORDER — DAYVIGO 5 MG PO TABS: 1.0000 | ORAL_TABLET | Freq: Every day | ORAL | 3 refills | Status: DC

## 2021-11-21 NOTE — Telephone Encounter (Signed)
HI Key,  ?I sent a new prescription over for [name].  Pretty sure it was denied last year but she would like to try again.  Okay to go ahead and initiate prior Auth. ?

## 2021-11-22 ENCOUNTER — Telehealth: Payer: Self-pay

## 2021-11-22 NOTE — Telephone Encounter (Addendum)
Initiated Prior authorization OVZ:CHYIFOYDXAJ ([name]) 5 MG TABS ?Via: Covermymeds ?Case/Key:[id number] ?Status: approved  as of 11/22/21 ?Reason: approved through 11/23/2022 ?Notified Pt via: Mychart  ?

## 2021-12-13 ENCOUNTER — Other Ambulatory Visit: Payer: Self-pay | Admitting: Family Medicine

## 2021-12-13 DIAGNOSIS — F4323 Adjustment disorder with mixed anxiety and depressed mood: Secondary | ICD-10-CM

## 2021-12-13 DIAGNOSIS — L700 Acne vulgaris: Secondary | ICD-10-CM

## 2022-01-08 ENCOUNTER — Ambulatory Visit: Payer: 59 | Admitting: Family Medicine

## 2022-01-12 ENCOUNTER — Other Ambulatory Visit: Payer: Self-pay | Admitting: Family Medicine

## 2022-01-12 DIAGNOSIS — G4700 Insomnia, unspecified: Secondary | ICD-10-CM

## 2022-01-14 ENCOUNTER — Other Ambulatory Visit: Payer: Self-pay | Admitting: Family Medicine

## 2022-01-14 DIAGNOSIS — G4700 Insomnia, unspecified: Secondary | ICD-10-CM

## 2022-01-15 ENCOUNTER — Ambulatory Visit: Payer: 59 | Admitting: Family Medicine

## 2022-01-29 ENCOUNTER — Encounter: Payer: Self-pay | Admitting: Family Medicine

## 2022-01-29 ENCOUNTER — Telehealth (INDEPENDENT_AMBULATORY_CARE_PROVIDER_SITE_OTHER): Payer: 59 | Admitting: Family Medicine

## 2022-01-29 VITALS — Ht 71.0 in | Wt 176.0 lb

## 2022-01-29 DIAGNOSIS — G4700 Insomnia, unspecified: Secondary | ICD-10-CM

## 2022-01-29 DIAGNOSIS — Z7689 Persons encountering health services in other specified circumstances: Secondary | ICD-10-CM | POA: Diagnosis not present

## 2022-01-29 DIAGNOSIS — L700 Acne vulgaris: Secondary | ICD-10-CM | POA: Diagnosis not present

## 2022-01-29 MED ORDER — PHENTERMINE HCL 15 MG PO CAPS: 15.0000 mg | ORAL_CAPSULE | ORAL | 1 refills | Status: DC

## 2022-01-29 NOTE — Assessment & Plan Note (Signed)
Skin seems much better but still having occasional breakouts.  Discussed using more regularly to see if improves consistent results if not consider adding a retinoid.

## 2022-01-29 NOTE — Assessment & Plan Note (Signed)
Visit #:8 Starting Weight:209lb  Current weight:176lb Previous weight:182lb Change in weight: down 6lbs. Goal weight:Ideal 165-175 lb. Dietary goals: work on getting in protein with each meal.has been doing well with this. Lean and green meals. Exercise goals:Really encouraged her to try to set some small exercise goals even if it is just 15 or 20 minutes. Add resistance bands to week.consider interval training if she doesn't have a lot of time.  Continue walking a lot. Medication:We will taper down to 15 mg phentermine daily. Follow up:13mo

## 2022-01-29 NOTE — Progress Notes (Signed)
Virtual Visit via Telephone Note  I connected with Drinda Butts on 01/29/22 at  3:40 PM EDT by telephone and verified that I am speaking with the correct person using two identifiers.   I discussed the limitations, risks, security and privacy concerns of performing an evaluation and management service by telephone and the availability of in person appointments. I also discussed with the patient that there may be a patient responsible charge related to this service. The patient expressed understanding and agreed to proceed.  Patient location: in care Provider loccation: In office   Subjective   Patient ID: Priscilla Cook, female    DOB: Mar 08, 1981  Age: 41 y.o. MRN: [id number]  Chief Complaint  Patient presents with   Weight Check   F/U Weight Management. She is doing well with the medication.  Is on phentermine 37.5 mg.  Tolerating well.  She feels like her weight has been stable for a while so she is willing to start to taper backwards.  She has noticed that she is feeling particularly tired and wanting to sleep more.  She says it almost feels like when she was pregnant but she knows she is not pregnant.  But it can be that overwhelming sleepy sensation that she gets especially when driving and she has to drive a lot for work.  She feels like she is getting better sleep at night in fact so she does not feel sleep deprived as she was previously.  No snoring.  HPI    ROS    Objective:     Ht 5\' 11"  (1.803 m)   Wt 176 lb (79.8 kg)   BMI 24.55 kg/m    Physical Exam Vitals and nursing note reviewed.  Constitutional:      Appearance: She is well-developed.  HENT:     Head: Normocephalic and atraumatic.  Cardiovascular:     Rate and Rhythm: Normal rate and regular rhythm.     Heart sounds: Normal heart sounds.  Pulmonary:     Effort: Pulmonary effort is normal.     Breath sounds: Normal breath sounds.  Skin:    General: Skin is warm and dry.  Neurological:      Mental Status: She is alert and oriented to person, place, and time.  Psychiatric:        Behavior: Behavior normal.      No results found for any visits on 01/29/22.    The ASCVD Risk score (Arnett DK, et al., 2019) failed to calculate for the following reasons:   The 2019 ASCVD risk score is only valid for ages 73 to 21    Assessment & Plan:   Problem List Items Addressed This Visit       Musculoskeletal and Integument   Cystic acne    Skin seems much better but still having occasional breakouts.  Discussed using more regularly to see if improves consistent results if not consider adding a retinoid.        Other   Insomnia    Seems to be better but still struggling with daytime sleepiness.  Working to try moving the sertraline to bedtime and see if this helps if not then consider referral to sleep specialist.      Encounter for weight management - Primary    Visit #: 8 Starting Weight: 209 lb   Current weight: 176 lb Previous weight:182 lb  Change in weight: down 6 lbs.  Goal weight: Ideal 165-175 lb. Dietary goals: work on  getting in protein with each meal. has been doing well with this. Lean and green meals.  Exercise goals: Really encouraged her to try to set some small exercise goals even if it is just 15 or 20 minutes.  Add resistance bands to week. consider interval training if she doesn't have a lot of time.  Continue walking a lot.  Medication: We will taper down to 15 mg phentermine daily. Follow up : 2 mo         Relevant Medications   phentermine 15 MG capsule    No follow-ups on file.      I discussed the assessment and treatment plan with the patient. The patient was provided an opportunity to ask questions and all were answered. The patient agreed with the plan and demonstrated an understanding of the instructions.   The patient was advised to call back or seek an in-person evaluation if the symptoms worsen or if the condition fails to improve  as anticipated.  I provided 15 minutes of non-face-to-face time during this encounter.   Nani Gasser, MD  Nani Gasser, MD

## 2022-01-29 NOTE — Assessment & Plan Note (Signed)
Seems to be better but still struggling with daytime sleepiness.  Working to try moving the sertraline to bedtime and see if this helps if not then consider referral to sleep specialist.

## 2022-02-24 IMAGING — DX DG CHEST 2V
2 series · 2 of 2 positions shown · non-contrast
Comparison: Prior chest x-ray 04/15/2008

CLINICAL DATA: Ten day history of cough, COVID positive

EXAM:
CHEST - 2 VIEW

[chest pa]
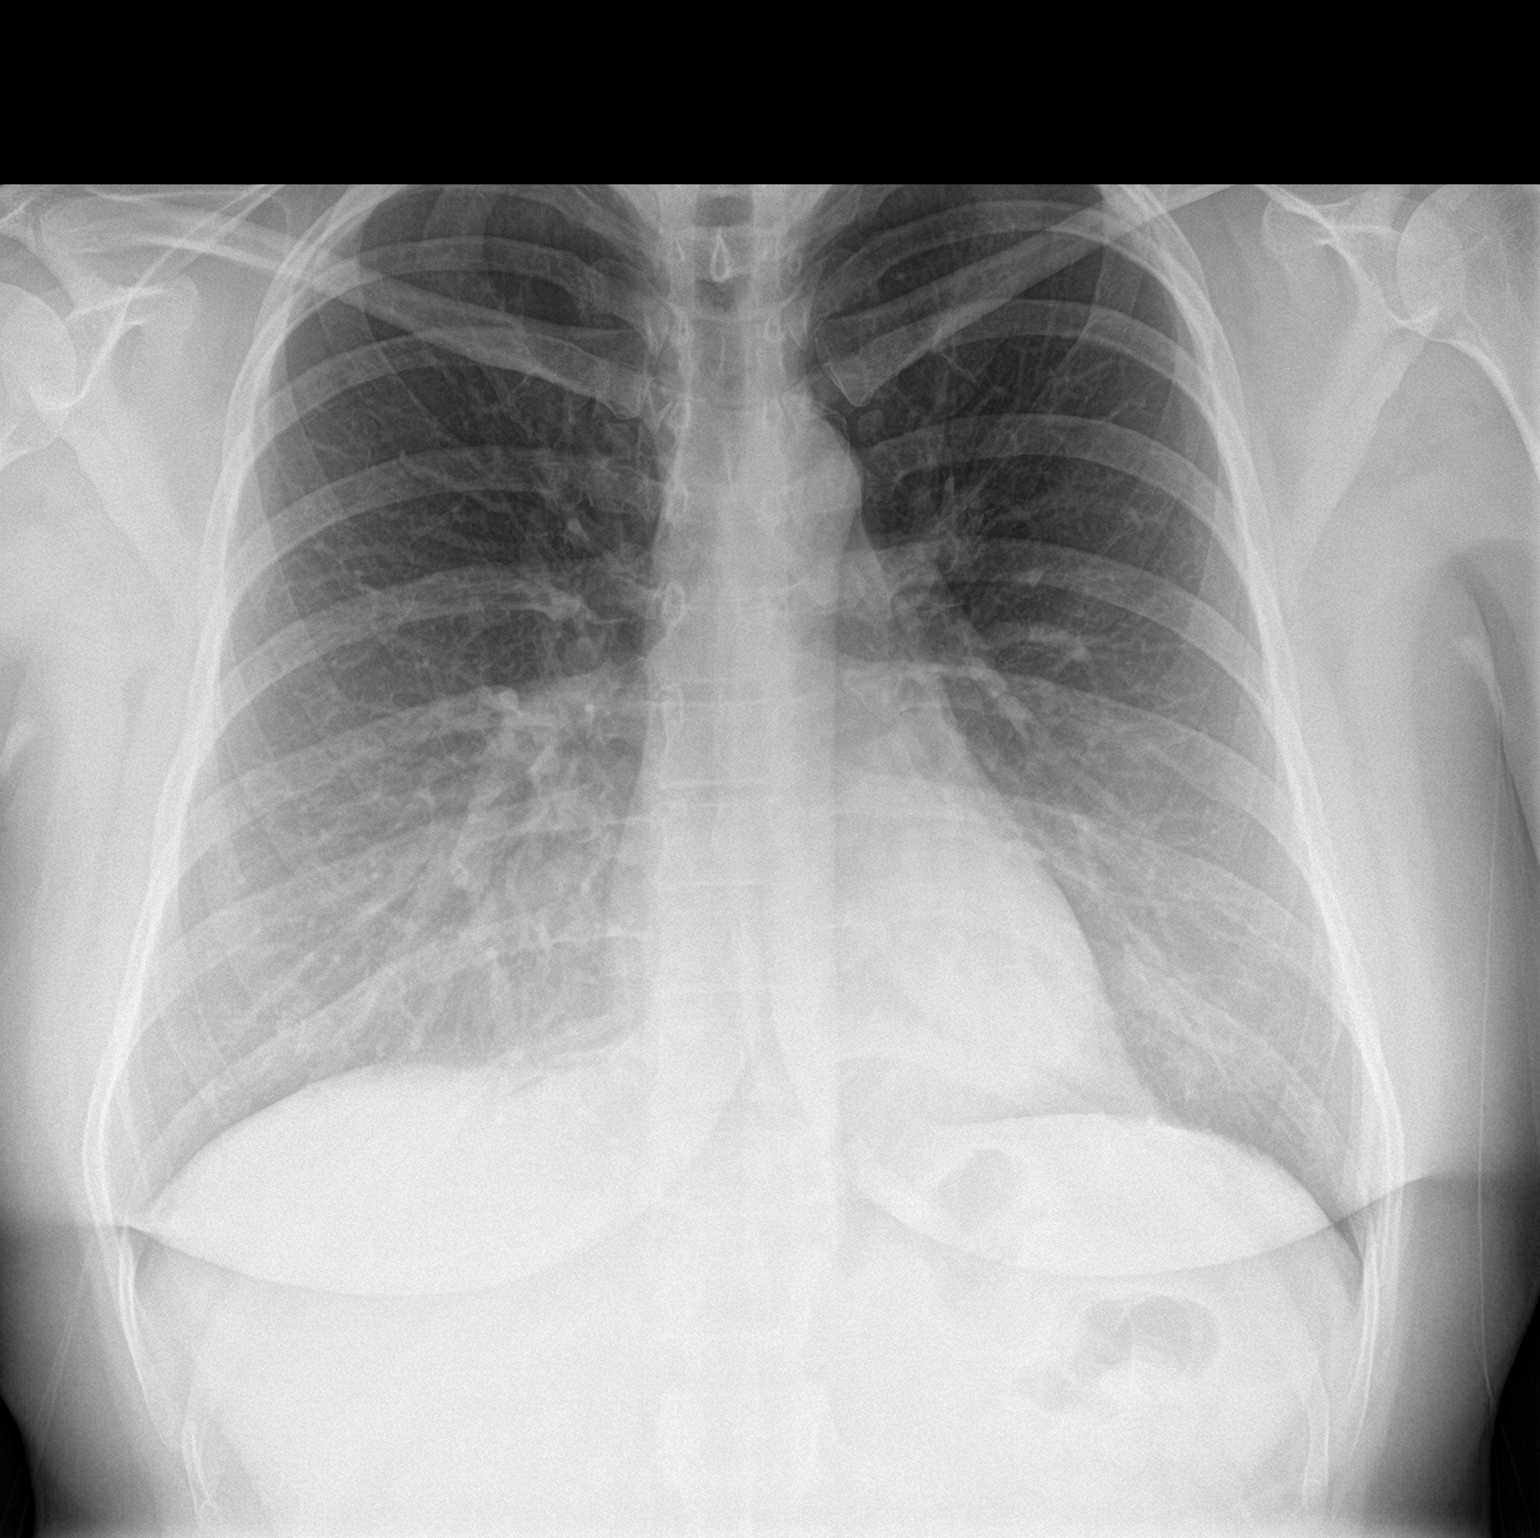

[chest lat]
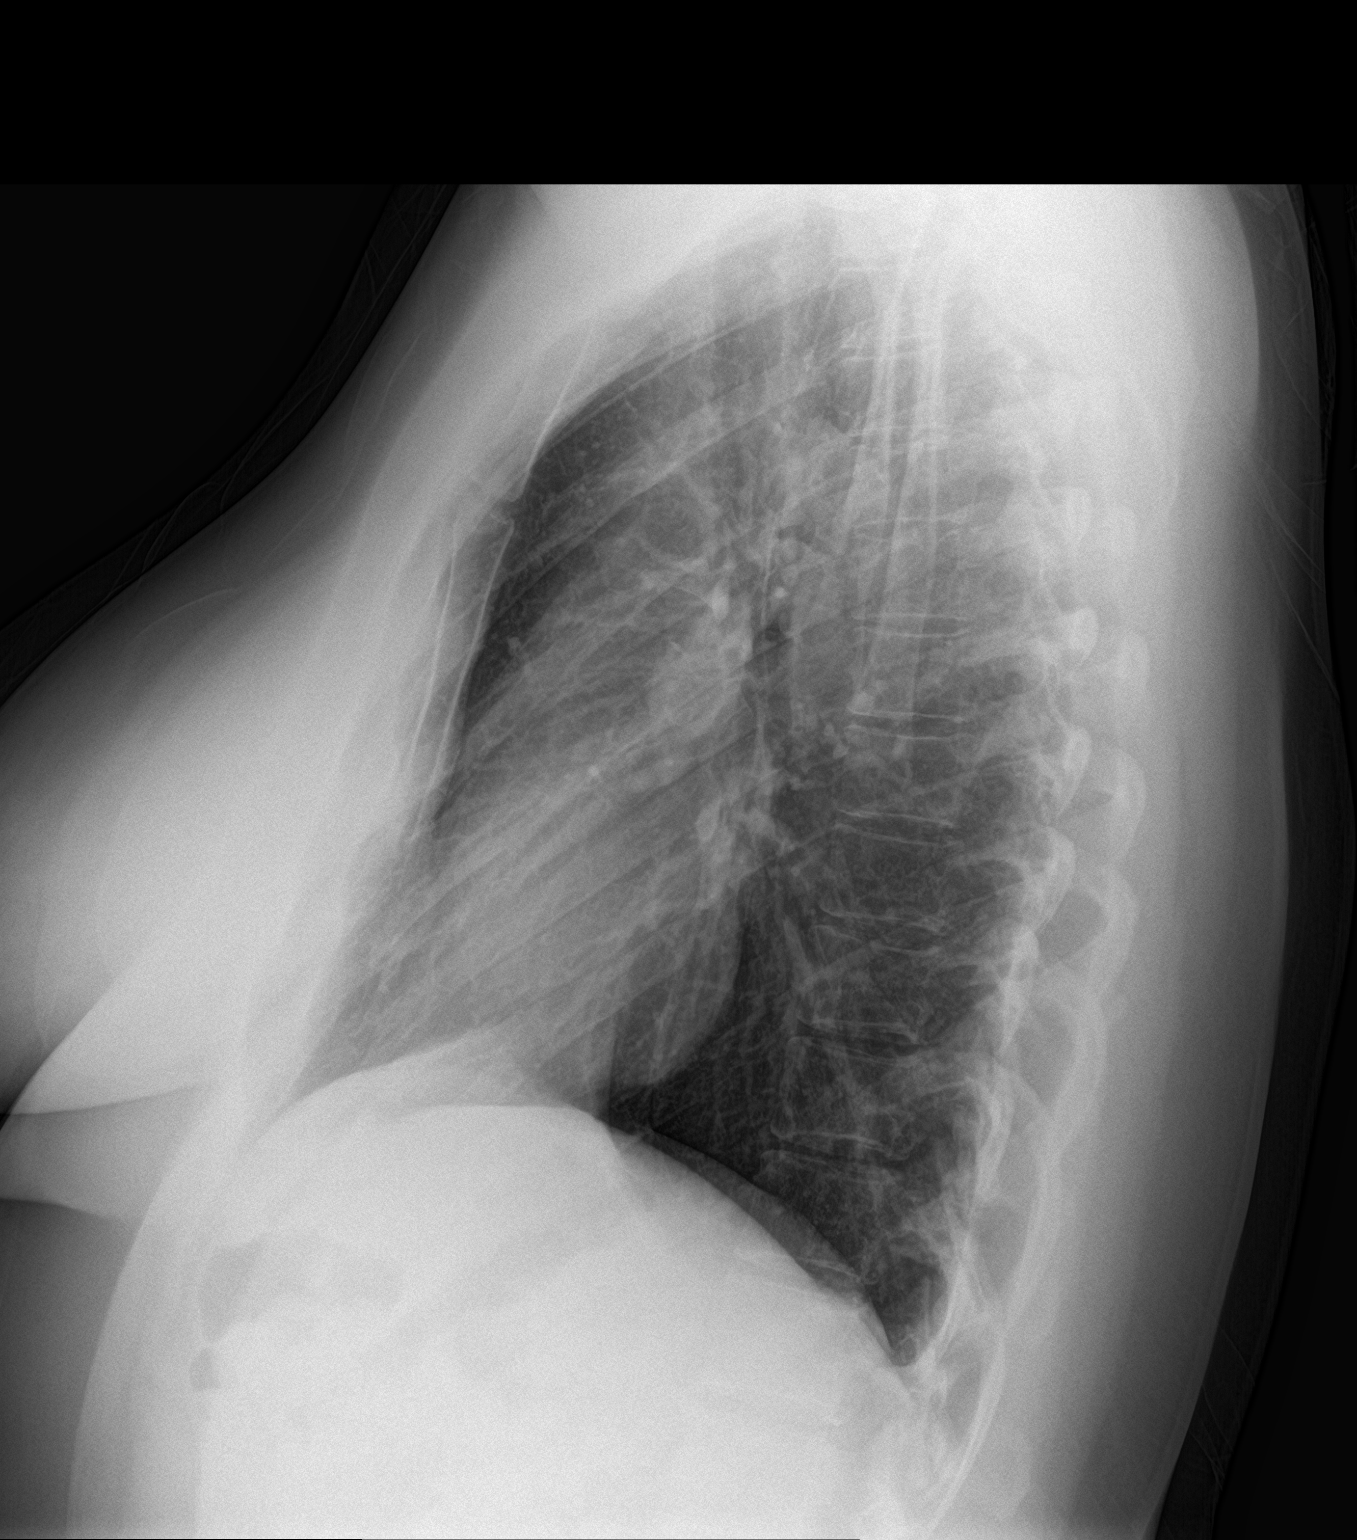

[2 of 2 positions shown; findings below may reference images not displayed]

FINDINGS: The lungs are clear and negative for focal airspace consolidation,
pulmonary edema or suspicious pulmonary nodule. No pleural effusion
or pneumothorax. Cardiac and mediastinal contours are within normal
limits. No acute fracture or lytic or blastic osseous lesions. The
visualized upper abdominal bowel gas pattern is unremarkable.
IMPRESSION: Negative chest x-ray.

## 2022-02-25 IMAGING — CT CT ANGIO CHEST
3 of 9 series · 18 of 36 positions shown · IV contrast (omnipaque)
Comparison: None.

CLINICAL DATA: Positive D-dimer, COVID day 10, chest pain,
low/intermediate prob PE

EXAM:
CT ANGIOGRAPHY CHEST WITH CONTRAST
TECHNIQUE: Multidetector CT imaging of the chest was performed using the
standard protocol during bolus administration of intravenous
contrast. Multiplanar CT image reconstructions and MIPs were
obtained to evaluate the vascular anatomy.
CONTRAST:  100mL OMNIPAQUE IOHEXOL 350 MG/ML SOLN

[Series 5: pe lung · axial · 0.84mm/px · z∈[-223,-85]mm · 3 of 92 slices shown]
[im 23/92  mediastinal]
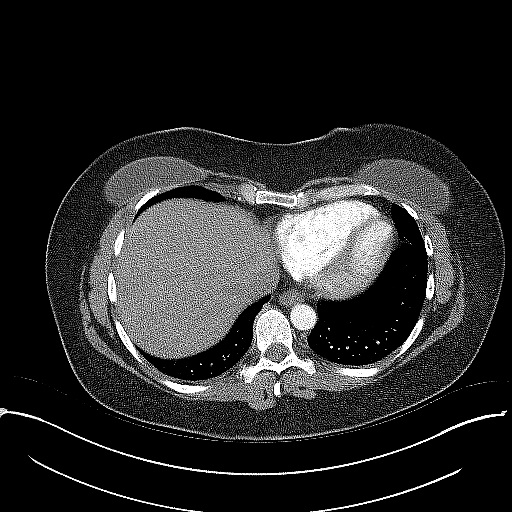
[im 46/92  mediastinal]
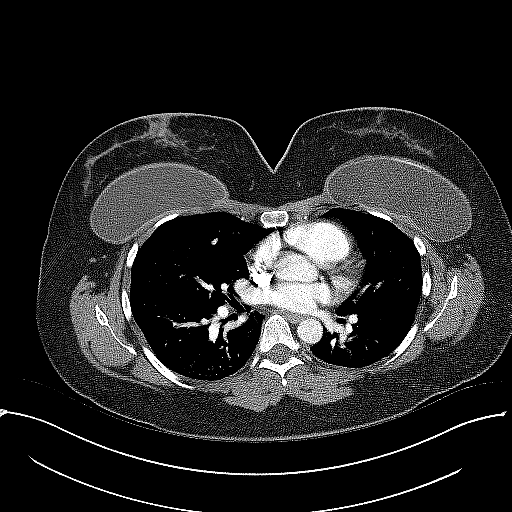
[im 69/92  mediastinal]
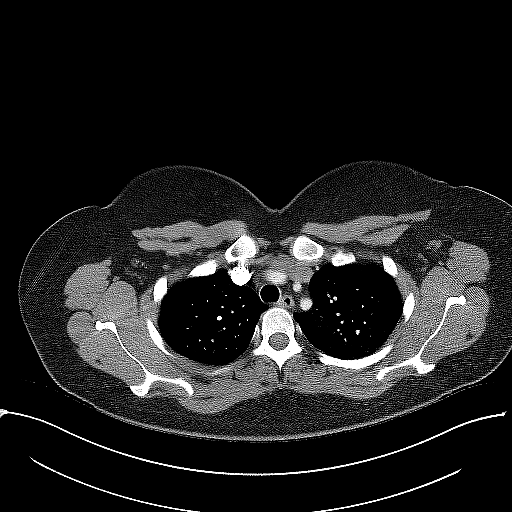

[Series 6: pe coronal mpr · coronal · 0.56mm/px · 1 of 137 slices shown]
[im 69/137  mediastinal]
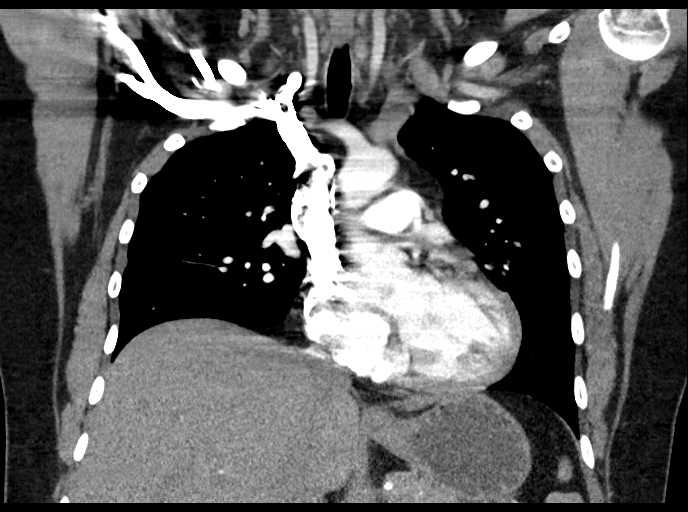

[Series 10: pe thins · axial · 0.84mm/px · z∈[-272,-35]mm · 14 of 275 slices shown]
[im 19/275  lung]
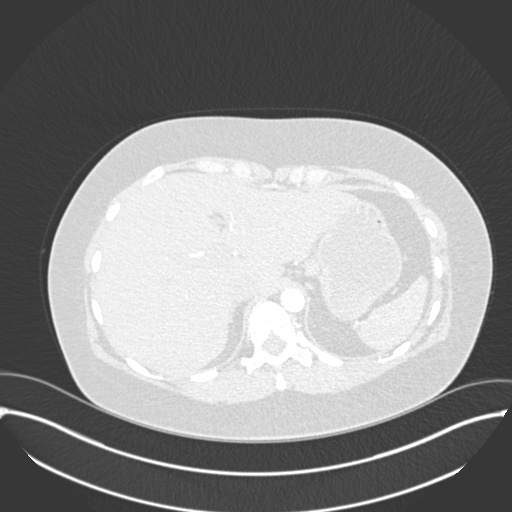
[im 37/275  mediastinal]
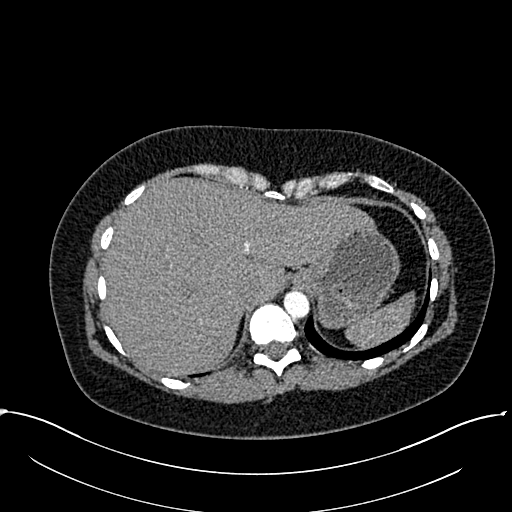
[im 55/275  lung]
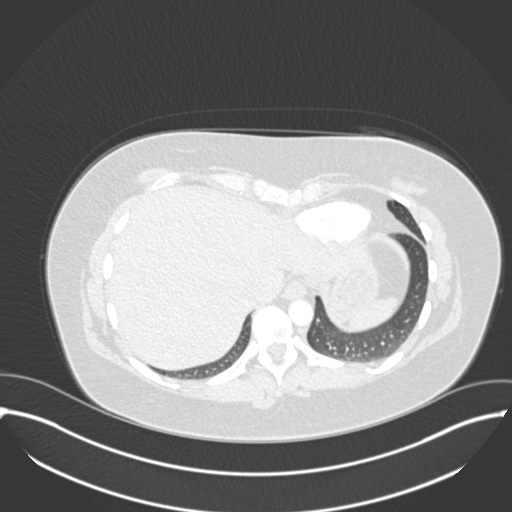
[im 74/275  mediastinal]
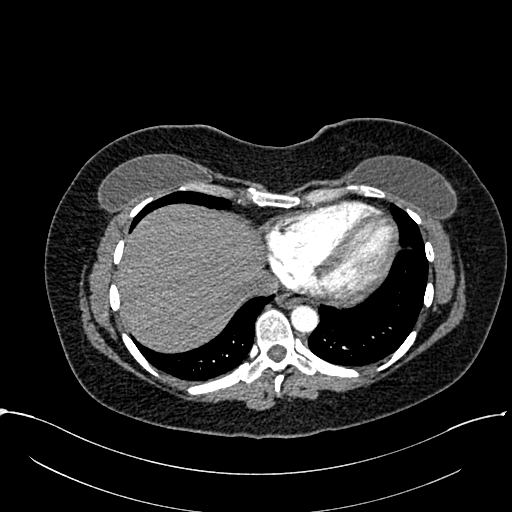
[im 92/275  lung]
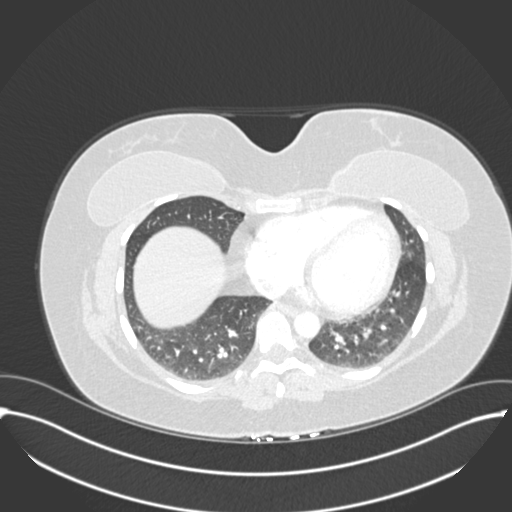
[im 110/275  mediastinal]
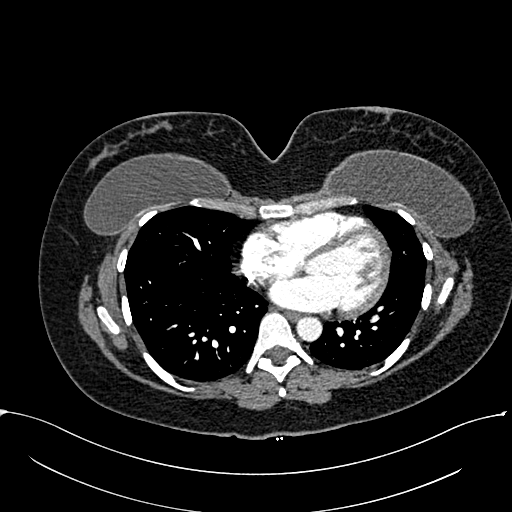
[im 128/275  lung]
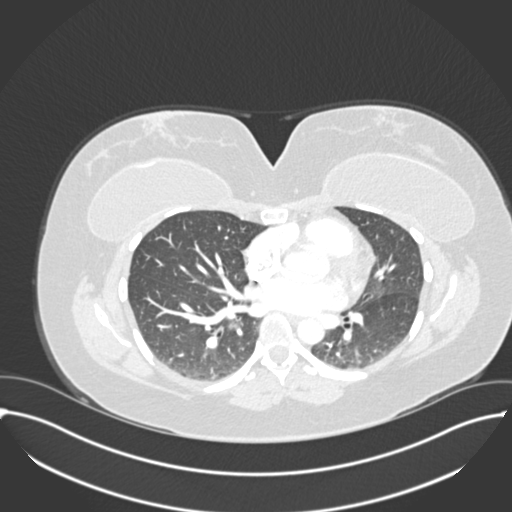
[im 147/275  mediastinal]
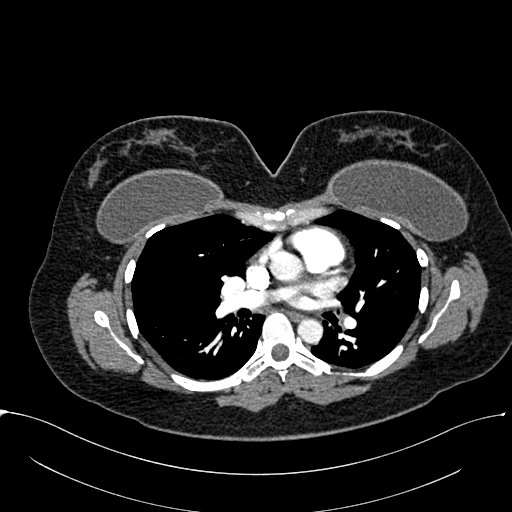
[im 165/275  lung]
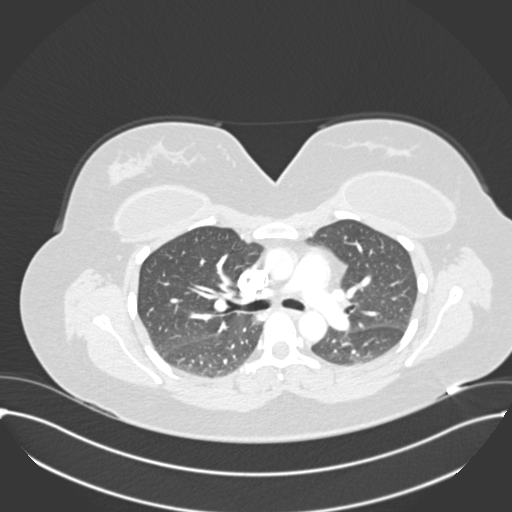
[im 183/275  mediastinal]
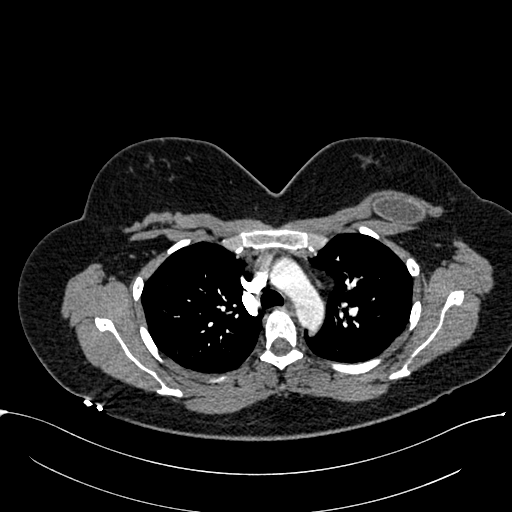
[im 201/275  lung]
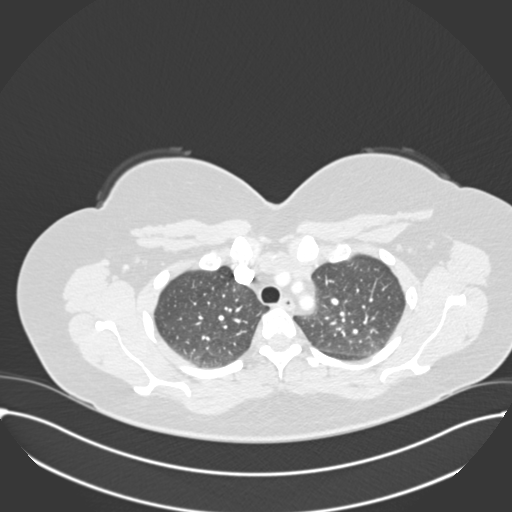
[im 220/275  mediastinal]
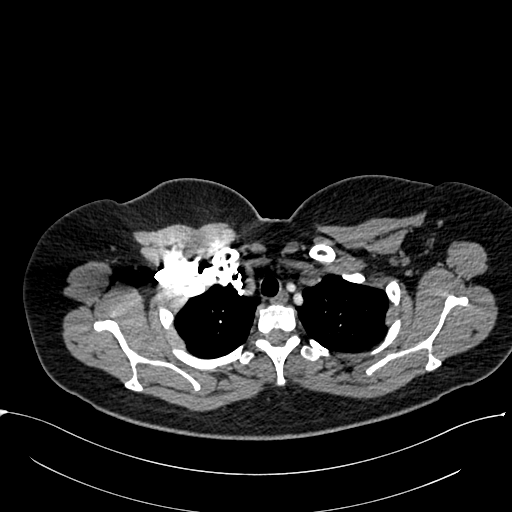
[im 238/275  lung]
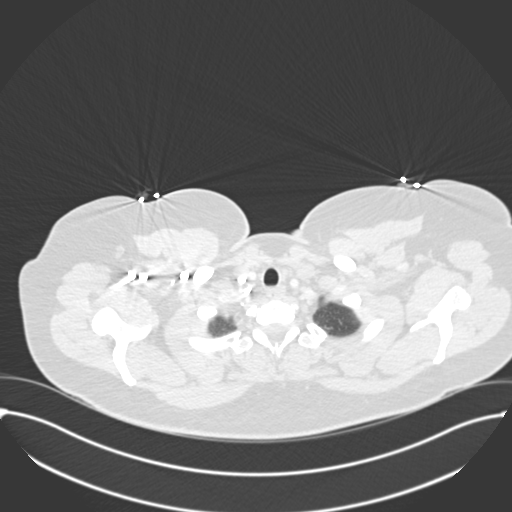
[im 256/275  mediastinal]
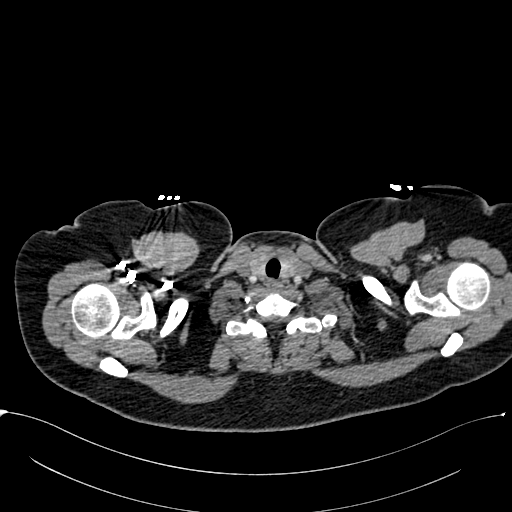

[18 of 36 positions shown; findings below may reference images not displayed]

FINDINGS: Cardiovascular: Heart size normal. No pericardial effusion.
Satisfactory opacification of pulmonary arteries noted, and there is
no evidence of pulmonary emboli. Adequate contrast opacification of
the thoracic aorta with no evidence of dissection, aneurysm, or
stenosis. There is classic 3-vessel brachiocephalic arch anatomy
without proximal stenosis.

Mediastinum/Nodes: No mass or adenopathy.

Lungs/Pleura: No pleural effusion. No pneumothorax. Minimal
dependent atelectasis posteriorly in the lower lobes. Lungs
otherwise clear.

Upper Abdomen: No acute findings.

Musculoskeletal: Breast implants.  Regional bones unremarkable.

Review of the MIP images confirms the above findings.
IMPRESSION: Negative for acute PE or thoracic aortic dissection.

## 2022-03-21 ENCOUNTER — Other Ambulatory Visit: Payer: Self-pay | Admitting: Family Medicine

## 2022-03-21 DIAGNOSIS — F4323 Adjustment disorder with mixed anxiety and depressed mood: Secondary | ICD-10-CM

## 2022-05-01 ENCOUNTER — Other Ambulatory Visit: Payer: Self-pay | Admitting: Family Medicine

## 2022-05-01 DIAGNOSIS — L700 Acne vulgaris: Secondary | ICD-10-CM

## 2022-06-06 ENCOUNTER — Other Ambulatory Visit: Payer: Self-pay | Admitting: Family Medicine

## 2022-06-06 DIAGNOSIS — G4700 Insomnia, unspecified: Secondary | ICD-10-CM

## 2022-06-06 NOTE — Telephone Encounter (Signed)
Last written 01/12/2022 #30 with 2 refills  Last appt in July, told to follow up in 2 months.   Please advise.

## 2022-06-27 ENCOUNTER — Encounter: Payer: Self-pay | Admitting: Family Medicine

## 2022-06-27 DIAGNOSIS — Z7689 Persons encountering health services in other specified circumstances: Secondary | ICD-10-CM

## 2022-06-29 MED ORDER — PHENTERMINE HCL 15 MG PO CAPS: 15.0000 mg | ORAL_CAPSULE | ORAL | 0 refills | Status: DC

## 2022-06-29 NOTE — Telephone Encounter (Signed)
Meds ordered this encounter  ?Medications  ? phentermine 15 MG capsule  ?  Sig: Take 1 capsule (15 mg total) by mouth every morning.  ?  Dispense:  30 capsule  ?  Refill:  0  ? ? ?

## 2022-07-08 ENCOUNTER — Other Ambulatory Visit: Payer: Self-pay | Admitting: Family Medicine

## 2022-07-08 DIAGNOSIS — F4323 Adjustment disorder with mixed anxiety and depressed mood: Secondary | ICD-10-CM

## 2022-07-24 ENCOUNTER — Other Ambulatory Visit: Payer: Self-pay

## 2022-07-24 DIAGNOSIS — F4323 Adjustment disorder with mixed anxiety and depressed mood: Secondary | ICD-10-CM

## 2022-07-24 MED ORDER — SERTRALINE HCL 50 MG PO TABS: 50.0000 mg | ORAL_TABLET | Freq: Every day | ORAL | 0 refills | Status: DC

## 2022-08-01 ENCOUNTER — Other Ambulatory Visit: Payer: Self-pay | Admitting: Family Medicine

## 2022-08-01 DIAGNOSIS — L700 Acne vulgaris: Secondary | ICD-10-CM

## 2022-09-03 ENCOUNTER — Other Ambulatory Visit: Payer: Self-pay | Admitting: Family Medicine

## 2022-09-03 DIAGNOSIS — G4700 Insomnia, unspecified: Secondary | ICD-10-CM

## 2022-09-03 NOTE — Telephone Encounter (Signed)
Please have patient's schedule a follow-up appointment and then I can refill her medication she has to be seen at least every 6 months for the [facility]

## 2022-09-03 NOTE — Telephone Encounter (Signed)
Patient scheduled for 09/06/2022 for medication refill, thanks.

## 2022-09-06 ENCOUNTER — Encounter: Payer: Self-pay | Admitting: Family Medicine

## 2022-09-06 ENCOUNTER — Ambulatory Visit (INDEPENDENT_AMBULATORY_CARE_PROVIDER_SITE_OTHER): Payer: 59 | Admitting: Family Medicine

## 2022-09-06 VITALS — BP 106/64 | HR 71 | Ht 71.0 in | Wt 185.0 lb

## 2022-09-06 DIAGNOSIS — L719 Rosacea, unspecified: Secondary | ICD-10-CM

## 2022-09-06 DIAGNOSIS — F4323 Adjustment disorder with mixed anxiety and depressed mood: Secondary | ICD-10-CM | POA: Diagnosis not present

## 2022-09-06 DIAGNOSIS — G4700 Insomnia, unspecified: Secondary | ICD-10-CM | POA: Diagnosis not present

## 2022-09-06 MED ORDER — SERTRALINE HCL 100 MG PO TABS: 100.0000 mg | ORAL_TABLET | Freq: Every day | ORAL | 1 refills | Status: DC

## 2022-09-06 NOTE — Assessment & Plan Note (Signed)
We discussed options.  I do think she would benefit from therapy/counseling but she says she is really struggled to get that into her schedule right now but she will think about it.  In the meantime we will get a do a trial of increasing the sertraline to 100 mg for couple months to see if this is helpful as well.  If she is doing well then plan to follow back up in 6 months otherwise I can see her back sooner.

## 2022-09-06 NOTE — Assessment & Plan Note (Signed)
Here today for 37-monthfollow-up for insomnia.  Previously tried trazodone in the past and it was not effective.  SRead Driverswas also not effective and worsened her restless leg.  Currently on [name] and doing well.

## 2022-09-06 NOTE — Assessment & Plan Note (Signed)
We discussed options we could retry a retinoid or retry the metronidazole gel but may be just using it less frequently to start and then gradually over weeks increasing the frequency of use as long as it is not causing irritation to the skin.

## 2022-09-06 NOTE — Progress Notes (Signed)
Established Patient Office Visit  Subjective   Patient ID: Priscilla Cook, female    DOB: [birth year]  Age: 42 y.o. MRN: [id number]  Chief Complaint  Patient presents with   Insomnia    HPI Follow-up insomnia-reports doing OK on the [name]. Has some night still not sleeping great vut overall is helpful    Acne -for about 2 weeks out of the month right on her menstrual cycle she will get redness bumps and scale on her face.  Decided to try the metronidazole gel but she felt like it really caused her skin to flare and breakout and she actually had little pustules so she stopped it.  She has tried retinoids in the past.  Still just feeling a lot of stress and concern.  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  And so the home pieces may stressful for her.  Ear canal has been a little itchy.  She would like me to check it today.    ROS    Objective:     BP 106/64   Pulse 71   Ht '5\' 11"'$  (1.803 m)   Wt 185 lb (83.9 kg)   SpO2 98%   BMI 25.80 kg/m    Physical Exam Vitals and nursing note reviewed.  Constitutional:      Appearance: She is well-developed.  HENT:     Head: Normocephalic and atraumatic.     Right Ear: Tympanic membrane, ear canal and external ear normal.  Cardiovascular:     Rate and Rhythm: Normal rate and regular rhythm.     Heart sounds: Normal heart sounds.  Pulmonary:     Effort: Pulmonary effort is normal.     Breath sounds: Normal breath sounds.  Skin:    General: Skin is warm and dry.  Neurological:     Mental Status: She is alert and oriented to person, place, and time.  Psychiatric:        Behavior: Behavior normal.     No results found for any visits on 09/06/22.    The ASCVD Risk score (Arnett DK, et al., 2019) failed to calculate for the  following reasons:   The 2019 ASCVD risk score is only valid for ages 82 to 85    Assessment & Plan:   Problem List Items Addressed This Visit       Musculoskeletal and Integument   Rosacea    We discussed options we could retry a retinoid or retry the metronidazole gel but may be just using it less frequently to start and then gradually over weeks increasing the frequency of use as long as it is not causing irritation to the skin.        Other   Insomnia - Primary    Here today for 23-monthfollow-up for insomnia.  Previously tried trazodone in the past and it was not effective.  SRead Driverswas also not effective and worsened her restless leg.  Currently on [name] and doing well.      Adjustment reaction with anxiety and depression    We discussed options.  I do think she would benefit from therapy/counseling but she says she is really struggled to get that into her schedule right now but she will think about it.  In the meantime we will  get a do a trial of increasing the sertraline to 100 mg for couple months to see if this is helpful as well.  If she is doing well then plan to follow back up in 6 months otherwise I can see her back sooner.      Relevant Medications   sertraline (ZOLOFT) 100 MG tablet    No follow-ups on file.    Beatrice Lecher, MD

## 2022-10-17 ENCOUNTER — Encounter: Payer: Self-pay | Admitting: Family Medicine

## 2022-11-05 ENCOUNTER — Other Ambulatory Visit: Payer: Self-pay | Admitting: Family Medicine

## 2022-11-05 DIAGNOSIS — L700 Acne vulgaris: Secondary | ICD-10-CM

## 2022-11-06 ENCOUNTER — Other Ambulatory Visit: Payer: Self-pay | Admitting: Family Medicine

## 2022-11-06 DIAGNOSIS — L700 Acne vulgaris: Secondary | ICD-10-CM

## 2022-11-06 MED ORDER — SPIRONOLACTONE 25 MG PO TABS
25.0000 mg | ORAL_TABLET | Freq: Every day | ORAL | 0 refills | Status: DC
Start: 2022-11-06 — End: 2022-11-21

## 2022-11-20 ENCOUNTER — Other Ambulatory Visit: Payer: Self-pay | Admitting: Family Medicine

## 2022-11-20 DIAGNOSIS — G4700 Insomnia, unspecified: Secondary | ICD-10-CM

## 2022-11-20 DIAGNOSIS — L700 Acne vulgaris: Secondary | ICD-10-CM

## 2022-12-01 ENCOUNTER — Other Ambulatory Visit: Payer: Self-pay | Admitting: Family Medicine

## 2022-12-01 DIAGNOSIS — F4323 Adjustment disorder with mixed anxiety and depressed mood: Secondary | ICD-10-CM

## 2022-12-31 ENCOUNTER — Other Ambulatory Visit: Payer: Self-pay | Admitting: Family Medicine

## 2022-12-31 DIAGNOSIS — F4323 Adjustment disorder with mixed anxiety and depressed mood: Secondary | ICD-10-CM

## 2023-01-01 NOTE — Telephone Encounter (Signed)
Spoke with patient . She was not feeling well on the 100mg  sertraline so she went back down to the 50mg . She does not currently need a refill as she has enough by cutting her 100mg  tablets in half. She will contact us once a refill is needed.

## 2023-03-13 ENCOUNTER — Other Ambulatory Visit: Payer: Self-pay | Admitting: Family Medicine

## 2023-03-13 DIAGNOSIS — G4700 Insomnia, unspecified: Secondary | ICD-10-CM

## 2023-03-13 DIAGNOSIS — L700 Acne vulgaris: Secondary | ICD-10-CM

## 2023-06-04 ENCOUNTER — Other Ambulatory Visit: Payer: Self-pay | Admitting: Family Medicine

## 2023-06-04 DIAGNOSIS — G4700 Insomnia, unspecified: Secondary | ICD-10-CM

## 2023-06-17 ENCOUNTER — Other Ambulatory Visit: Payer: Self-pay | Admitting: Family Medicine

## 2023-06-17 DIAGNOSIS — L700 Acne vulgaris: Secondary | ICD-10-CM

## 2023-07-12 DIAGNOSIS — Z1231 Encounter for screening mammogram for malignant neoplasm of breast: Secondary | ICD-10-CM | POA: Diagnosis not present

## 2023-09-16 ENCOUNTER — Other Ambulatory Visit: Payer: Self-pay | Admitting: Family Medicine

## 2023-09-16 ENCOUNTER — Encounter: Payer: Self-pay | Admitting: Family Medicine

## 2023-09-16 DIAGNOSIS — G4700 Insomnia, unspecified: Secondary | ICD-10-CM

## 2023-09-16 NOTE — Telephone Encounter (Signed)
 Forwarding message to [facility] covering DR. Metheney Requesting rx rf of Davigo Last written 06/05/2023 Last OV 09/06/2022 Upcoming appt

## 2023-09-16 NOTE — Telephone Encounter (Signed)
 Last Fill: Filled today, confirmed rcvd by pharmacy  No further action needed  Routing to provider for review/authorization.   Copied from CRM [id number]. Topic: Clinical - Medication Refill >> Sep 16, 2023  5:04 PM Priscille Loveless wrote: Most Recent Primary Care Visit:  Provider: Nani Gasser D  Department: [facility]  Visit Type: OFFICE VISIT  Date: 09/06/2022  Medication: Lemborexant ([name]) 5 MG TABS  Has the patient contacted their pharmacy? Yes  Is this the correct pharmacy for this prescription? Yes  This is the patient's preferred pharmacy:  [id number] - [place], [place] - [address] AT [facility] [address] [place] [address] Phone: [phone] Fax: [phone]   Has the prescription been filled recently? No  Is the patient out of the medication? Yes  Has the patient been seen for an appointment in the last year OR does the patient have an upcoming appointment? No  Can we respond through MyChart? Yes  Agent: Please be advised that Rx refills may take up to 3 business days. We ask that you follow-up with your pharmacy.

## 2023-09-16 NOTE — Telephone Encounter (Signed)
 Attempted call to patient to assist her in scheduling an appt as has been over one year since last visit.  Left a voice mail message requesting a return call.

## 2023-09-17 ENCOUNTER — Other Ambulatory Visit: Payer: Self-pay | Admitting: Family Medicine

## 2023-09-17 DIAGNOSIS — G4700 Insomnia, unspecified: Secondary | ICD-10-CM

## 2023-09-17 NOTE — Telephone Encounter (Signed)
 Patient scheduled for 09/20/23 with Priscilla Cook

## 2023-09-20 ENCOUNTER — Ambulatory Visit (INDEPENDENT_AMBULATORY_CARE_PROVIDER_SITE_OTHER): Admitting: Physician Assistant

## 2023-09-20 VITALS — BP 108/71 | HR 90 | Ht 71.0 in | Wt 156.0 lb

## 2023-09-20 DIAGNOSIS — G478 Other sleep disorders: Secondary | ICD-10-CM | POA: Diagnosis not present

## 2023-09-20 DIAGNOSIS — L700 Acne vulgaris: Secondary | ICD-10-CM | POA: Diagnosis not present

## 2023-09-20 DIAGNOSIS — R5383 Other fatigue: Secondary | ICD-10-CM | POA: Diagnosis not present

## 2023-09-20 DIAGNOSIS — F4323 Adjustment disorder with mixed anxiety and depressed mood: Secondary | ICD-10-CM | POA: Diagnosis not present

## 2023-09-20 DIAGNOSIS — G4700 Insomnia, unspecified: Secondary | ICD-10-CM | POA: Diagnosis not present

## 2023-09-20 MED ORDER — SERTRALINE HCL 100 MG PO TABS: 100.0000 mg | ORAL_TABLET | Freq: Every day | ORAL | 1 refills | Status: DC

## 2023-09-20 MED ORDER — SPIRONOLACTONE 25 MG PO TABS: 25.0000 mg | ORAL_TABLET | Freq: Every day | ORAL | 3 refills | Status: DC

## 2023-09-20 NOTE — Progress Notes (Signed)
 Established Patient Office Visit  Subjective   Patient ID: Priscilla Cook, female    DOB: 1981-03-24  Age: 43 y.o. MRN: [id number]  Chief Complaint  Patient presents with   Insomnia    HPI Pt is a 43 yo female who presents to the clinic for refills.   Priscilla Cook has worked the best for insomnia but pt continues to have problems with sleep and waking up multiple times a times and night and feeling like she is not getting good rest.   Her mood is controlled. No concerns. She is stressed with life but overall concerning mood findings.   Continues to use spironolactone for cystic acne.   .. Active Ambulatory Problems    Diagnosis Date Noted   Adjustment reaction with anxiety and depression 02/22/2009   Family history of gallbladder disease 05/09/2018   Encounter for weight management 01/03/2021   Cystic acne 05/02/2021   Rosacea 06/05/2021   Insomnia 06/06/2021   Non-restorative sleep 09/20/2023   No energy 09/20/2023   Resolved Ambulatory Problems    Diagnosis Date Noted   Right upper quadrant abdominal pain 05/09/2018   Nausea 05/09/2018   BPPV (benign paroxysmal positional vertigo), right 05/02/2021   No Additional Past Medical History       ROS See HPI.    Objective:     BP 108/71   Pulse 90   Ht 5\' 11"  (1.803 m)   Wt 156 lb (70.8 kg)   SpO2 100%   BMI 21.76 kg/m  BP Readings from Last 3 Encounters:  09/20/23 108/71  09/06/22 106/64  11/06/21 124/69   Wt Readings from Last 3 Encounters:  09/20/23 156 lb (70.8 kg)  09/06/22 185 lb (83.9 kg)  01/29/22 176 lb (79.8 kg)     ..    09/20/2023    9:37 AM 09/06/2022   11:24 AM 01/29/2022    3:48 PM 09/04/2021    9:13 AM 07/04/2021   10:01 AM  Depression screen PHQ 2/9  Decreased Interest 1 1 0 0 0  Down, Depressed, Hopeless 1 1 0 0 1  PHQ - 2 Score 2 2 0 0 1  Altered sleeping 3 3 3  2   Tired, decreased energy 3 3 2  1   Change in appetite 2 3 1  1   Feeling bad or failure about yourself  1 1 0  0   Trouble concentrating 3 1 3  2   Moving slowly or fidgety/restless 1 0 0  1  Suicidal thoughts 0 0 0  0  PHQ-9 Score 15 13 9  8   Difficult doing work/chores Somewhat difficult Somewhat difficult Somewhat difficult  Somewhat difficult   .Marland Kitchen    09/20/2023    9:38 AM 09/06/2022   11:30 AM 01/29/2022    3:50 PM 03/10/2021    1:37 PM  GAD 7 : Generalized Anxiety Score  Nervous, Anxious, on Edge 3 3 1 1   Control/stop worrying 2 2 1 1   Worry too much - different things 2 2 1 1   Trouble relaxing 3 3 1 1   Restless 2 1 1 1   Easily annoyed or irritable 3 1 2 2   Afraid - awful might happen 1 1 0 0  Total GAD 7 Score 16 13 7 7   Anxiety Difficulty Somewhat difficult Somewhat difficult Somewhat difficult Somewhat difficult     Physical Exam Constitutional:      Appearance: Normal appearance.  HENT:     Head: Normocephalic.  Cardiovascular:  Rate and Rhythm: Normal rate and regular rhythm.  Pulmonary:     Effort: Pulmonary effort is normal.     Breath sounds: Normal breath sounds.  Musculoskeletal:     Right lower leg: No edema.     Left lower leg: No edema.  Neurological:     General: No focal deficit present.     Mental Status: She is alert and oriented to person, place, and time.  Psychiatric:        Mood and Affect: Mood normal.        Assessment & Plan:  Marland KitchenMarland KitchenNyeema was seen today for insomnia.  Diagnoses and all orders for this visit:  Insomnia, unspecified type -     Ambulatory referral to Sleep Studies  Adjustment reaction with anxiety and depression -     sertraline (ZOLOFT) 100 MG tablet; Take 1 tablet (100 mg total) by mouth daily. -     CMP14+EGFR  Cystic acne -     spironolactone (ALDACTONE) 25 MG tablet; Take 1 tablet (25 mg total) by mouth daily. -     TSH + free T4  No energy -     CMP14+EGFR -     TSH + free T4 -     CBC w/Diff/Platelet -     Fe+TIBC+Fer -     VITAMIN D 25 Hydroxy (Vit-D Deficiency, Fractures) -     B12 and Folate  Panel  Non-restorative sleep -     CMP14+EGFR -     TSH + free T4 -     CBC w/Diff/Platelet -     Fe+TIBC+Fer -     VITAMIN D 25 Hydroxy (Vit-D Deficiency, Fractures) -     B12 and Folate Panel -     Ambulatory referral to Sleep Studies   Priscilla Cook was sent earlier this week but she is waiting for her pharmacy to get in stock. Pt continues to have signficant problems with sleep despite trying many different medications Sleep referral made Labs ordered for any metabolic causes of not energy  PHQ/GAD not to goal but pt reports control Refilled zoloft  Refilled spiroloactolone for acne   Priscilla Gaw, PA-C

## 2023-09-20 NOTE — Patient Instructions (Addendum)
 Get labs today.  Will make referral to sleep medicine.

## 2023-09-22 ENCOUNTER — Encounter: Payer: Self-pay | Admitting: Physician Assistant

## 2023-09-22 LAB — CBC WITH DIFFERENTIAL/PLATELET
Basophils Absolute: 0 10*3/uL (ref 0.0–0.2)
Basos: 1 %
EOS (ABSOLUTE): 0.1 10*3/uL (ref 0.0–0.4)
Eos: 1 %
Hematocrit: 40.9 % (ref 34.0–46.6)
Hemoglobin: 13.5 g/dL (ref 11.1–15.9)
Immature Grans (Abs): 0 10*3/uL (ref 0.0–0.1)
Immature Granulocytes: 0 %
Lymphocytes Absolute: 2 10*3/uL (ref 0.7–3.1)
Lymphs: 38 %
MCH: 31 pg (ref 26.6–33.0)
MCHC: 33 g/dL (ref 31.5–35.7)
MCV: 94 fL (ref 79–97)
Monocytes Absolute: 0.4 10*3/uL (ref 0.1–0.9)
Monocytes: 8 %
Neutrophils Absolute: 2.7 10*3/uL (ref 1.4–7.0)
Neutrophils: 52 %
Platelets: 270 10*3/uL (ref 150–450)
RBC: 4.36 x10E6/uL (ref 3.77–5.28)
RDW: 12.6 % (ref 11.7–15.4)
WBC: 5.3 10*3/uL (ref 3.4–10.8)

## 2023-09-22 LAB — CMP14+EGFR
ALT: 43 IU/L — ABNORMAL HIGH (ref 0–32)
AST: 36 IU/L (ref 0–40)
Albumin: 4.5 g/dL (ref 3.9–4.9)
Alkaline Phosphatase: 39 IU/L — ABNORMAL LOW (ref 44–121)
BUN/Creatinine Ratio: 14 (ref 9–23)
BUN: 13 mg/dL (ref 6–24)
Bilirubin Total: 0.4 mg/dL (ref 0.0–1.2)
CO2: 17 mmol/L — ABNORMAL LOW (ref 20–29)
Calcium: 9.6 mg/dL (ref 8.7–10.2)
Chloride: 103 mmol/L (ref 96–106)
Creatinine, Ser: 0.91 mg/dL (ref 0.57–1.00)
Globulin, Total: 2.6 g/dL (ref 1.5–4.5)
Glucose: 71 mg/dL (ref 70–99)
Potassium: 4.2 mmol/L (ref 3.5–5.2)
Sodium: 140 mmol/L (ref 134–144)
Total Protein: 7.1 g/dL (ref 6.0–8.5)
eGFR: 82 mL/min/{1.73_m2} (ref >59)

## 2023-09-22 LAB — IRON,TIBC AND FERRITIN PANEL
Ferritin: 128 ng/mL (ref 15–150)
Iron Saturation: 35 % (ref 15–55)
Iron: 154 ug/dL (ref 27–159)
Total Iron Binding Capacity: 439 ug/dL (ref 250–450)
UIBC: 285 ug/dL (ref 131–425)

## 2023-09-22 LAB — VITAMIN D 25 HYDROXY (VIT D DEFICIENCY, FRACTURES): Vit D, 25-Hydroxy: 173.6 ng/mL — ABNORMAL HIGH (ref 30.0–100.0)

## 2023-09-22 LAB — B12 AND FOLATE PANEL
Folate: >20 ng/mL (ref >3.0)
Vitamin B-12: 1146 pg/mL (ref 232–1245)

## 2023-09-22 LAB — TSH+FREE T4
Free T4: 1.12 ng/dL (ref 0.82–1.77)
TSH: 4.13 u[IU]/mL (ref 0.450–4.500)

## 2023-09-23 ENCOUNTER — Encounter: Payer: Self-pay | Admitting: Physician Assistant

## 2023-09-23 NOTE — Progress Notes (Signed)
 Priscilla Cook,   B12 and folate normal.  Iron and hemoglobin look good.  Thyroid normal range but trending towards hypo(low)thyroid. Recheck in 6 weeks.  Your vitamin D is TOO high. How much are you taking?? ALT, liver enzyme, up too. Avoid alcohol and tylenol products and will recheck in 6 weeks with vitamin D, TSH.

## 2023-09-29 ENCOUNTER — Other Ambulatory Visit: Payer: Self-pay | Admitting: Family Medicine

## 2023-09-29 DIAGNOSIS — F4323 Adjustment disorder with mixed anxiety and depressed mood: Secondary | ICD-10-CM

## 2023-09-29 DIAGNOSIS — L700 Acne vulgaris: Secondary | ICD-10-CM

## 2023-10-29 ENCOUNTER — Other Ambulatory Visit: Payer: Self-pay | Admitting: Family Medicine

## 2023-10-29 DIAGNOSIS — L700 Acne vulgaris: Secondary | ICD-10-CM

## 2023-10-29 DIAGNOSIS — F4323 Adjustment disorder with mixed anxiety and depressed mood: Secondary | ICD-10-CM

## 2023-12-12 ENCOUNTER — Encounter: Payer: Self-pay | Admitting: Physician Assistant

## 2023-12-12 ENCOUNTER — Other Ambulatory Visit: Payer: Self-pay | Admitting: Physician Assistant

## 2023-12-12 DIAGNOSIS — G4700 Insomnia, unspecified: Secondary | ICD-10-CM

## 2023-12-13 NOTE — Telephone Encounter (Signed)
  Forwarding patient message and refill request to [facility] covering Dr.Metheney Requesting rx rf of [name]  Last written 09/16/2023 Last OV 09/20/2023 Upcoming appt 03/23/2024

## 2024-02-16 ENCOUNTER — Encounter: Payer: Self-pay | Admitting: Family Medicine

## 2024-02-18 DIAGNOSIS — M5441 Lumbago with sciatica, right side: Secondary | ICD-10-CM | POA: Diagnosis not present

## 2024-02-19 DIAGNOSIS — R52 Pain, unspecified: Secondary | ICD-10-CM | POA: Diagnosis not present

## 2024-02-19 DIAGNOSIS — M47816 Spondylosis without myelopathy or radiculopathy, lumbar region: Secondary | ICD-10-CM | POA: Diagnosis not present

## 2024-02-21 DIAGNOSIS — Z79891 Long term (current) use of opiate analgesic: Secondary | ICD-10-CM | POA: Diagnosis not present

## 2024-02-21 DIAGNOSIS — M5441 Lumbago with sciatica, right side: Secondary | ICD-10-CM | POA: Diagnosis not present

## 2024-03-01 DIAGNOSIS — M5117 Intervertebral disc disorders with radiculopathy, lumbosacral region: Secondary | ICD-10-CM | POA: Diagnosis not present

## 2024-03-16 ENCOUNTER — Other Ambulatory Visit: Payer: Self-pay | Admitting: Family Medicine

## 2024-03-16 ENCOUNTER — Encounter: Payer: Self-pay | Admitting: Physician Assistant

## 2024-03-16 DIAGNOSIS — G4700 Insomnia, unspecified: Secondary | ICD-10-CM

## 2024-03-17 NOTE — Telephone Encounter (Signed)
 Last 12/18/2023 #90 Next appt 03/23/2024

## 2024-03-23 ENCOUNTER — Ambulatory Visit: Admitting: Physician Assistant

## 2024-03-23 VITALS — BP 125/82 | HR 70 | Ht 71.0 in | Wt 156.0 lb

## 2024-03-23 DIAGNOSIS — F4323 Adjustment disorder with mixed anxiety and depressed mood: Secondary | ICD-10-CM

## 2024-03-23 DIAGNOSIS — G4700 Insomnia, unspecified: Secondary | ICD-10-CM

## 2024-03-23 DIAGNOSIS — R7989 Other specified abnormal findings of blood chemistry: Secondary | ICD-10-CM | POA: Diagnosis not present

## 2024-03-23 DIAGNOSIS — G478 Other sleep disorders: Secondary | ICD-10-CM

## 2024-03-23 DIAGNOSIS — E673 Hypervitaminosis D: Secondary | ICD-10-CM | POA: Diagnosis not present

## 2024-03-23 DIAGNOSIS — L299 Pruritus, unspecified: Secondary | ICD-10-CM

## 2024-03-23 DIAGNOSIS — R748 Abnormal levels of other serum enzymes: Secondary | ICD-10-CM

## 2024-03-23 DIAGNOSIS — F331 Major depressive disorder, recurrent, moderate: Secondary | ICD-10-CM

## 2024-03-23 DIAGNOSIS — R4586 Emotional lability: Secondary | ICD-10-CM

## 2024-03-23 DIAGNOSIS — F419 Anxiety disorder, unspecified: Secondary | ICD-10-CM

## 2024-03-23 MED ORDER — SERTRALINE HCL 100 MG PO TABS: 100.0000 mg | ORAL_TABLET | Freq: Every day | ORAL | 1 refills | Status: DC

## 2024-03-23 MED ORDER — DAYVIGO 5 MG PO TABS
1.0000 | ORAL_TABLET | Freq: Every day | ORAL | 1 refills | Status: DC
Start: 2024-03-23 — End: 2024-03-25

## 2024-03-23 MED ORDER — BUPROPION HCL ER (XL) 150 MG PO TB24: 150.0000 mg | ORAL_TABLET | ORAL | 0 refills | Status: DC

## 2024-03-23 NOTE — Progress Notes (Signed)
 Established Patient Office Visit  Subjective   Patient ID: Priscilla Cook, female    DOB: 04-29-1981  Age: 43 y.o. MRN: [id number]  Chief Complaint  Patient presents with   Medical Management of Chronic Issues    Insomnia     HPI Pt is a 43 yo with insomnia, anxiety, MDD who presents today for medication management.   She is doing ok getting to sleep but still struggles staying asleep. She takes davigo daily. She is down and anxious. She is taking zoloft  and does not feel like it is helping like it should. She is still very fatigued and not motivated. She does not wake up feeling rested. Sleep medicine did not call to make appt.   Pt has hx of elevated liver enzymes and she has been itching more and wonders if correlated.    ROS See HPI.    Objective:     BP 125/82   Pulse 70   Ht 5' 11 (1.803 m)   Wt 156 lb (70.8 kg)   SpO2 99%   BMI 21.76 kg/m  BP Readings from Last 3 Encounters:  03/23/24 125/82  09/20/23 108/71  09/06/22 106/64   Wt Readings from Last 3 Encounters:  03/23/24 156 lb (70.8 kg)  09/20/23 156 lb (70.8 kg)  09/06/22 185 lb (83.9 kg)    ..    03/29/2024    3:28 PM 09/20/2023    9:37 AM 09/06/2022   11:24 AM 01/29/2022    3:48 PM 09/04/2021    9:13 AM  Depression screen PHQ 2/9  Decreased Interest 1 1 1  0 0  Down, Depressed, Hopeless 2 1 1  0 0  PHQ - 2 Score 3 2 2  0 0  Altered sleeping 1 3 3 3    Tired, decreased energy 1 3 3 2    Change in appetite 1 2 3 1    Feeling bad or failure about yourself  0 1 1 0   Trouble concentrating 2 3 1 3    Moving slowly or fidgety/restless 1 1 0 0   Suicidal thoughts 0 0 0 0   PHQ-9 Score 9 15 13 9    Difficult doing work/chores Somewhat difficult Somewhat difficult Somewhat difficult Somewhat difficult    ..    03/29/2024    3:29 PM 09/20/2023    9:38 AM 09/06/2022   11:30 AM 01/29/2022    3:50 PM  GAD 7 : Generalized Anxiety Score  Nervous, Anxious, on Edge 3 3 3 1   Control/stop worrying 2 2 2 1    Worry too much - different things 2 2 2 1   Trouble relaxing 2 3 3 1   Restless 2 2 1 1   Easily annoyed or irritable 2 3 1 2   Afraid - awful might happen 2 1 1  0  Total GAD 7 Score 15 16 13 7   Anxiety Difficulty Somewhat difficult Somewhat difficult Somewhat difficult Somewhat difficult      Physical Exam Constitutional:      Appearance: Normal appearance.  HENT:     Head: Normocephalic.  Cardiovascular:     Rate and Rhythm: Normal rate and regular rhythm.  Pulmonary:     Effort: Pulmonary effort is normal.     Breath sounds: Normal breath sounds.  Skin:    Findings: No rash.  Neurological:     General: No focal deficit present.     Mental Status: She is alert and oriented to person, place, and time.  Psychiatric:  Mood and Affect: Mood normal.        Assessment & Plan:  SABRASABRABevelyn was seen today for medical management of chronic issues.  Diagnoses and all orders for this visit:  Insomnia, unspecified type -     Discontinue: Lemborexant  ([name] ) 5 MG TABS; Take 1 tablet (5 mg total) by mouth at bedtime. -     Ambulatory referral to Sleep Studies  Elevated TSH -     TSH + free T4 -     VITAMIN D  25 Hydroxy (Vit-D Deficiency, Fractures) -     Hepatic function panel  Elevated liver enzymes -     TSH + free T4 -     VITAMIN D  25 Hydroxy (Vit-D Deficiency, Fractures) -     Hepatic function panel  Vitamin D  intoxication -     VITAMIN D  25 Hydroxy (Vit-D Deficiency, Fractures)  Non-restorative sleep -     Ambulatory referral to Sleep Studies  Itching -     TSH + free T4 -     VITAMIN D  25 Hydroxy (Vit-D Deficiency, Fractures) -     Hepatic function panel  Mood changes -     buPROPion  (WELLBUTRIN  XL) 150 MG 24 hr tablet; Take 1 tablet (150 mg total) by mouth every morning. -     sertraline  (ZOLOFT ) 100 MG tablet; Take 1 tablet (100 mg total) by mouth daily.  Moderate episode of recurrent major depressive disorder (HCC) -     buPROPion  (WELLBUTRIN  XL)  150 MG 24 hr tablet; Take 1 tablet (150 mg total) by mouth every morning. -     sertraline  (ZOLOFT ) 100 MG tablet; Take 1 tablet (100 mg total) by mouth daily.   Pt was not called for sleep medicine referral Placed again today Refilled [name]  Added wellbutrin  to zoloft  for mood Labs ordered for management  Return in about 3 months (around 06/22/2024) for Follow up.    Jamieson Hetland, PA-C

## 2024-03-24 ENCOUNTER — Ambulatory Visit: Payer: Self-pay | Admitting: Physician Assistant

## 2024-03-24 DIAGNOSIS — F331 Major depressive disorder, recurrent, moderate: Secondary | ICD-10-CM | POA: Insufficient documentation

## 2024-03-24 LAB — HEPATIC FUNCTION PANEL
ALT: 26 IU/L (ref 0–32)
AST: 26 IU/L (ref 0–40)
Albumin: 4.3 g/dL (ref 3.9–4.9)
Alkaline Phosphatase: 40 IU/L — ABNORMAL LOW (ref 41–116)
Bilirubin Total: 0.5 mg/dL (ref 0.0–1.2)
Bilirubin, Direct: 0.16 mg/dL (ref 0.00–0.40)
Total Protein: 7 g/dL (ref 6.0–8.5)

## 2024-03-24 LAB — TSH+FREE T4
Free T4: 0.95 ng/dL (ref 0.82–1.77)
TSH: 3.72 u[IU]/mL (ref 0.450–4.500)

## 2024-03-24 LAB — VITAMIN D 25 HYDROXY (VIT D DEFICIENCY, FRACTURES): Vit D, 25-Hydroxy: 78.5 ng/mL (ref 30.0–100.0)

## 2024-03-24 NOTE — Progress Notes (Signed)
 Liver enzymes back in normal range! Thyroid  normal range.  Vitamin D  looks MUCH better and in normal range.

## 2024-03-25 ENCOUNTER — Other Ambulatory Visit: Payer: Self-pay | Admitting: *Deleted

## 2024-03-25 DIAGNOSIS — R4586 Emotional lability: Secondary | ICD-10-CM

## 2024-03-25 DIAGNOSIS — F331 Major depressive disorder, recurrent, moderate: Secondary | ICD-10-CM

## 2024-03-25 DIAGNOSIS — G4700 Insomnia, unspecified: Secondary | ICD-10-CM

## 2024-03-25 MED ORDER — DAYVIGO 5 MG PO TABS: 1.0000 | ORAL_TABLET | Freq: Every day | ORAL | Status: DC

## 2024-03-29 ENCOUNTER — Encounter: Payer: Self-pay | Admitting: Physician Assistant

## 2024-03-29 DIAGNOSIS — F419 Anxiety disorder, unspecified: Secondary | ICD-10-CM | POA: Insufficient documentation

## 2024-04-14 DIAGNOSIS — G4700 Insomnia, unspecified: Secondary | ICD-10-CM | POA: Diagnosis not present

## 2024-04-14 DIAGNOSIS — G4719 Other hypersomnia: Secondary | ICD-10-CM | POA: Diagnosis not present

## 2024-04-26 DIAGNOSIS — R0683 Snoring: Secondary | ICD-10-CM | POA: Diagnosis not present

## 2024-04-26 DIAGNOSIS — G4710 Hypersomnia, unspecified: Secondary | ICD-10-CM | POA: Diagnosis not present

## 2024-04-27 DIAGNOSIS — G4709 Other insomnia: Secondary | ICD-10-CM | POA: Diagnosis not present

## 2024-04-27 DIAGNOSIS — G4710 Hypersomnia, unspecified: Secondary | ICD-10-CM | POA: Diagnosis not present

## 2024-05-25 ENCOUNTER — Encounter: Payer: Self-pay | Admitting: Family Medicine

## 2024-05-25 DIAGNOSIS — L719 Rosacea, unspecified: Secondary | ICD-10-CM

## 2024-05-25 DIAGNOSIS — L700 Acne vulgaris: Secondary | ICD-10-CM

## 2024-05-26 MED ORDER — METRONIDAZOLE 1 % EX GEL: Freq: Every day | CUTANEOUS | 3 refills | Status: AC

## 2024-06-06 ENCOUNTER — Other Ambulatory Visit: Payer: Self-pay | Admitting: Family Medicine

## 2024-06-06 DIAGNOSIS — G4700 Insomnia, unspecified: Secondary | ICD-10-CM

## 2024-06-22 ENCOUNTER — Ambulatory Visit: Admitting: Physician Assistant

## 2024-06-22 ENCOUNTER — Other Ambulatory Visit: Payer: Self-pay | Admitting: Physician Assistant

## 2024-06-22 DIAGNOSIS — F331 Major depressive disorder, recurrent, moderate: Secondary | ICD-10-CM

## 2024-06-22 DIAGNOSIS — R4586 Emotional lability: Secondary | ICD-10-CM

## 2024-07-13 ENCOUNTER — Other Ambulatory Visit: Payer: Self-pay | Admitting: Physician Assistant

## 2024-07-13 ENCOUNTER — Encounter: Payer: Self-pay | Admitting: Physician Assistant

## 2024-07-13 ENCOUNTER — Ambulatory Visit: Admitting: Physician Assistant

## 2024-07-13 VITALS — BP 122/85 | HR 87 | Ht 71.0 in | Wt 155.0 lb

## 2024-07-13 DIAGNOSIS — R7989 Other specified abnormal findings of blood chemistry: Secondary | ICD-10-CM | POA: Diagnosis not present

## 2024-07-13 DIAGNOSIS — R4586 Emotional lability: Secondary | ICD-10-CM | POA: Diagnosis not present

## 2024-07-13 DIAGNOSIS — Z23 Encounter for immunization: Secondary | ICD-10-CM

## 2024-07-13 DIAGNOSIS — R635 Abnormal weight gain: Secondary | ICD-10-CM

## 2024-07-13 DIAGNOSIS — Z131 Encounter for screening for diabetes mellitus: Secondary | ICD-10-CM

## 2024-07-13 DIAGNOSIS — R232 Flushing: Secondary | ICD-10-CM | POA: Diagnosis not present

## 2024-07-13 DIAGNOSIS — F331 Major depressive disorder, recurrent, moderate: Secondary | ICD-10-CM | POA: Diagnosis not present

## 2024-07-13 DIAGNOSIS — Z13220 Encounter for screening for lipoid disorders: Secondary | ICD-10-CM | POA: Diagnosis not present

## 2024-07-13 DIAGNOSIS — Z83430 Family history of elevated lipoprotein(a): Secondary | ICD-10-CM | POA: Diagnosis not present

## 2024-07-13 DIAGNOSIS — G4700 Insomnia, unspecified: Secondary | ICD-10-CM

## 2024-07-13 DIAGNOSIS — L700 Acne vulgaris: Secondary | ICD-10-CM

## 2024-07-13 DIAGNOSIS — G4710 Hypersomnia, unspecified: Secondary | ICD-10-CM | POA: Insufficient documentation

## 2024-07-13 MED ORDER — BUPROPION HCL ER (XL) 300 MG PO TB24: 300.0000 mg | ORAL_TABLET | Freq: Every day | ORAL | 1 refills | Status: AC

## 2024-07-13 MED ORDER — SERTRALINE HCL 50 MG PO TABS: 50.0000 mg | ORAL_TABLET | Freq: Every day | ORAL | 1 refills | Status: AC

## 2024-07-13 MED ORDER — DAYVIGO 10 MG PO TABS: 1.0000 | ORAL_TABLET | Freq: Every day | ORAL | 4 refills | Status: DC

## 2024-07-13 MED ORDER — ZEPBOUND 2.5 MG/0.5ML ~~LOC~~ SOAJ: 2.5000 mg | SUBCUTANEOUS | Status: AC

## 2024-07-13 NOTE — Progress Notes (Signed)
 "  Established Patient Office Visit  Subjective   Patient ID: Priscilla Cook, female    DOB: Nov 11, 1980  Age: 44 y.o. MRN: [id number]  Chief Complaint  Patient presents with   Medical Management of Chronic Issues    HPI .Discussed the use of AI scribe software for clinical note transcription with the patient, who gave verbal consent to proceed.  History of Present Illness Priscilla Cook is a 44 year old female who presents for follow-up of depression, anxiety, insomnia, and daytime sleepiness.  Mood disturbance and anxiety - Ongoing symptoms of depression and anxiety - Denies current feelings of depression - Experiences anxiety and stress related to full-time work and family responsibilities - Current medications include sertraline  at night and bupropion  in the morning; symptoms remain suboptimally managed  Sleep disturbance and daytime fatigue - Insomnia with inconsistent sleep and frequent awakenings, able to return to sleep - Daytime exhaustion despite sleep - [name]  prescribed for sleep, but symptoms persist - Previously evaluated by sleep medicine pulmonologist; home sleep study negative for diagnostic sleep apnea - In-lab sleep study recommended but not completed due to insurance denial and high out-of-pocket costs  Perimenopausal symptoms - Night sweats present, attributed to either medications or perimenopausal symptoms - History of hot flashes - Trial of hormone therapy for perimenopausal symptoms, including topical testosterone  cream, discontinued after 3-4 months due to facial breakouts and lack of improvement    ROS See HPI.    Objective:     BP 122/85   Pulse 87   Ht 5' 11 (1.803 m)   Wt 155 lb (70.3 kg)   SpO2 99%   BMI 21.62 kg/m  BP Readings from Last 3 Encounters:  07/13/24 122/85  03/23/24 125/82  09/20/23 108/71   Wt Readings from Last 3 Encounters:  07/13/24 155 lb (70.3 kg)  03/23/24 156 lb (70.8 kg)  09/20/23 156 lb (70.8 kg)     ..    07/13/2024    9:46 AM 03/29/2024    3:28 PM 09/20/2023    9:37 AM 09/06/2022   11:24 AM 01/29/2022    3:48 PM  Depression screen PHQ 2/9  Decreased Interest 1 1 1 1  0  Down, Depressed, Hopeless 1 2 1 1  0  PHQ - 2 Score 2 3 2 2  0  Altered sleeping 3 1 3 3 3   Tired, decreased energy 0 1 3 3 2   Change in appetite 0 1 2 3 1   Feeling bad or failure about yourself  0 0 1 1 0  Trouble concentrating 3 2 3 1 3   Moving slowly or fidgety/restless 0 1 1 0 0  Suicidal thoughts 0 0 0 0 0  PHQ-9 Score 8 9  15  13  9    Difficult doing work/chores Not difficult at all Somewhat difficult Somewhat difficult Somewhat difficult Somewhat difficult     Data saved with a previous flowsheet row definition   ..    07/13/2024    9:47 AM 03/29/2024    3:29 PM 09/20/2023    9:38 AM 09/06/2022   11:30 AM  GAD 7 : Generalized Anxiety Score  Nervous, Anxious, on Edge 2 3 3 3   Control/stop worrying 1 2 2 2   Worry too much - different things 1 2 2 2   Trouble relaxing 3 2 3 3   Restless 1 2 2 1   Easily annoyed or irritable 3 2 3 1   Afraid - awful might happen 1 2 1 1   Total GAD 7  Score 12 15 16 13   Anxiety Difficulty Not difficult at all Somewhat difficult Somewhat difficult Somewhat difficult      Physical Exam Constitutional:      Appearance: Normal appearance.  HENT:     Head: Normocephalic.  Cardiovascular:     Rate and Rhythm: Normal rate and regular rhythm.  Pulmonary:     Effort: Pulmonary effort is normal.     Breath sounds: Normal breath sounds.  Neurological:     General: No focal deficit present.     Mental Status: She is alert and oriented to person, place, and time.  Psychiatric:        Mood and Affect: Mood normal.         Assessment & Plan:  .Foster was seen today for medical management of chronic issues.  Diagnoses and all orders for this visit:  Insomnia, unspecified type -     Lemborexant  ([name] ) 10 MG TABS; Take 1 tablet (10 mg total) by mouth at  bedtime.  Moderate episode of recurrent major depressive disorder (HCC) -     buPROPion  (WELLBUTRIN  XL) 300 MG 24 hr tablet; Take 1 tablet (300 mg total) by mouth daily. -     sertraline  (ZOLOFT ) 50 MG tablet; Take 1 tablet (50 mg total) by mouth daily. -     CBC w/Diff/Platelet -     Fe+TIBC+Fer -     TSH + free T4 -     CMP14+EGFR  Mood changes -     buPROPion  (WELLBUTRIN  XL) 300 MG 24 hr tablet; Take 1 tablet (300 mg total) by mouth daily. -     sertraline  (ZOLOFT ) 50 MG tablet; Take 1 tablet (50 mg total) by mouth daily. -     CBC w/Diff/Platelet -     Fe+TIBC+Fer -     TSH + free T4 -     CMP14+EGFR  Hot flashes -     CBC w/Diff/Platelet -     Fe+TIBC+Fer -     TSH + free T4 -     CMP14+EGFR -     VITAMIN D  25 Hydroxy (Vit-D Deficiency, Fractures) -     B12 and Folate Panel -     FSH/LH -     Testosterone  -     Estradiol  -     Progesterone   Screening for diabetes mellitus -     CMP14+EGFR  Screening for lipoid disorders -     Lipid panel -     Lipoprotein A (LPA) -     High sensitivity CRP  Family history of elevated lipoprotein (a) -     Lipid panel -     Lipoprotein A (LPA) -     High sensitivity CRP  High serum vitamin D  -     VITAMIN D  25 Hydroxy (Vit-D Deficiency, Fractures)  Daytime hypersomnia -     Lemborexant  ([name] ) 10 MG TABS; Take 1 tablet (10 mg total) by mouth at bedtime. -     buPROPion  (WELLBUTRIN  XL) 300 MG 24 hr tablet; Take 1 tablet (300 mg total) by mouth daily. -     CBC w/Diff/Platelet -     Fe+TIBC+Fer -     TSH + free T4 -     CMP14+EGFR -     VITAMIN D  25 Hydroxy (Vit-D Deficiency, Fractures) -     B12 and Folate Panel  Immunization due -     Flu vaccine trivalent PF, 6mos and older(Flulaval,Afluria,Fluarix,Fluzone)  Abnormal weight gain -  tirzepatide  (ZEPBOUND ) 2.5 MG/0.5ML Pen; Inject 2.5 mg into the skin once a week.   Assessment & Plan Major depressive disorder, recurrent PHQ not to goal Recurrent major  depressive disorder with suboptimal management. Bupropion  beneficial; sertraline  ineffective. - Decreased sertraline  to 25 mg daily for 4-6 weeks to assess impact on mood and potential side effects. - Increased bupropion  to 300 mg in the morning to enhance morning alertness and mood.  Insomnia and daytime hypersomnia Chronic insomnia with frequent awakenings and daytime hypersomnia. [name]  effective without grogginess. - Increased [name]  to 10 mg to improve sleep quality. - Encouraged to schedule in-lab sleep study.  Perimenopausal symptoms with hot flashes Perimenopausal symptoms with significant hot flashes. Previous hormone therapy ineffective with adverse effects. Considering Veozah for hot flashes. - Ordered hormone labs including FSH, LH, testosterone , estradiol , and progesterone  to assess hormone levels. - Will consider Veozah for hot flashes if hormone levels indicate perimenopausal status.  General health maintenance Routine health maintenance discussed. Mammogram normal last year. Blood pressure slightly elevated. Discussed monitoring lipoprotein(a) due to family history. - Rechecked blood pressure. - Ordered lipoprotein(a) test due to family history of heart issues. - Administered flu shot.      Return in about 6 months (around 01/10/2025).    Jazzlene Huot, PA-C  "

## 2024-07-13 NOTE — Patient Instructions (Signed)
 Zoloft  decrease to 25mg  daily Wellbutrin  increase to 300mg  daily [name]  increase to 10mg  at bedtime  Will get labs today.

## 2024-07-14 ENCOUNTER — Ambulatory Visit: Payer: Self-pay | Admitting: Physician Assistant

## 2024-07-14 DIAGNOSIS — E7841 Elevated Lipoprotein(a): Secondary | ICD-10-CM | POA: Insufficient documentation

## 2024-07-14 LAB — CMP14+EGFR
ALT: 26 IU/L (ref 0–32)
AST: 28 IU/L (ref 0–40)
Albumin: 3.9 g/dL (ref 3.9–4.9)
Alkaline Phosphatase: 38 IU/L — ABNORMAL LOW (ref 41–116)
BUN/Creatinine Ratio: 14 (ref 9–23)
BUN: 13 mg/dL (ref 6–24)
Bilirubin Total: 0.6 mg/dL (ref 0.0–1.2)
CO2: 21 mmol/L (ref 20–29)
Calcium: 9.3 mg/dL (ref 8.7–10.2)
Chloride: 102 mmol/L (ref 96–106)
Creatinine, Ser: 0.96 mg/dL (ref 0.57–1.00)
Globulin, Total: 2.4 g/dL (ref 1.5–4.5)
Glucose: 90 mg/dL (ref 70–99)
Potassium: 4.2 mmol/L (ref 3.5–5.2)
Sodium: 137 mmol/L (ref 134–144)
Total Protein: 6.3 g/dL (ref 6.0–8.5)
eGFR: 76 mL/min/1.73

## 2024-07-14 LAB — FSH/LH
FSH: <0.3 m[IU]/mL
LH: <0.3 m[IU]/mL

## 2024-07-14 LAB — IRON,TIBC AND FERRITIN PANEL
Ferritin: 116 ng/mL (ref 15–150)
Iron Saturation: 45 % (ref 15–55)
Iron: 189 ug/dL — ABNORMAL HIGH (ref 27–159)
Total Iron Binding Capacity: 416 ug/dL (ref 250–450)
UIBC: 227 ug/dL (ref 131–425)

## 2024-07-14 LAB — HIGH SENSITIVITY CRP: CRP, High Sensitivity: 3 mg/L (ref 0.00–3.00)

## 2024-07-14 LAB — LIPID PANEL
Chol/HDL Ratio: 1.9 ratio (ref 0.0–4.4)
Cholesterol, Total: 200 mg/dL — ABNORMAL HIGH (ref 100–199)
HDL: 105 mg/dL
LDL Chol Calc (NIH): 75 mg/dL (ref 0–99)
Triglycerides: 120 mg/dL (ref 0–149)
VLDL Cholesterol Cal: 20 mg/dL (ref 5–40)

## 2024-07-14 LAB — CBC WITH DIFFERENTIAL/PLATELET
Basophils Absolute: 0.1 x10E3/uL (ref 0.0–0.2)
Basos: 1 %
EOS (ABSOLUTE): 0.2 x10E3/uL (ref 0.0–0.4)
Eos: 3 %
Hematocrit: 41.3 % (ref 34.0–46.6)
Hemoglobin: 13.4 g/dL (ref 11.1–15.9)
Immature Grans (Abs): 0 x10E3/uL (ref 0.0–0.1)
Immature Granulocytes: 0 %
Lymphocytes Absolute: 2.3 x10E3/uL (ref 0.7–3.1)
Lymphs: 37 %
MCH: 30.9 pg (ref 26.6–33.0)
MCHC: 32.4 g/dL (ref 31.5–35.7)
MCV: 95 fL (ref 79–97)
Monocytes Absolute: 0.6 x10E3/uL (ref 0.1–0.9)
Monocytes: 10 %
Neutrophils Absolute: 3 x10E3/uL (ref 1.4–7.0)
Neutrophils: 49 %
Platelets: 299 x10E3/uL (ref 150–450)
RBC: 4.34 x10E6/uL (ref 3.77–5.28)
RDW: 12.3 % (ref 11.7–15.4)
WBC: 6.2 x10E3/uL (ref 3.4–10.8)

## 2024-07-14 LAB — VITAMIN D 25 HYDROXY (VIT D DEFICIENCY, FRACTURES): Vit D, 25-Hydroxy: 82.1 ng/mL (ref 30.0–100.0)

## 2024-07-14 LAB — LIPOPROTEIN A (LPA): Lipoprotein (a): 254.2 nmol/L — ABNORMAL HIGH

## 2024-07-14 LAB — TESTOSTERONE: Testosterone: 22 ng/dL (ref 4–50)

## 2024-07-14 LAB — SPECIMEN STATUS REPORT

## 2024-07-14 LAB — B12 AND FOLATE PANEL
Folate: >20 ng/mL
Vitamin B-12: 789 pg/mL (ref 232–1245)

## 2024-07-14 LAB — TSH+FREE T4
Free T4: 0.93 ng/dL (ref 0.82–1.77)
TSH: 6.31 u[IU]/mL — ABNORMAL HIGH (ref 0.450–4.500)

## 2024-07-14 LAB — PROGESTERONE: Progesterone: 0.4 ng/mL

## 2024-07-14 LAB — ESTRADIOL: Estradiol: 9.2 pg/mL

## 2024-07-14 NOTE — Progress Notes (Signed)
 Asante,   Elevated cardiac risk with lipoprotein A elevated. Will document this but right now no need for statin due to lipid panel. Your LDL and HDL look great. If wanted we could order CT coronary calcium test to evaluate any coronary calcium elevation to further work up cardiac risk. Thoughts?   B12 and vitamin D  look great.   Your FSH and LH is non-existent which is concerning that pituitary gland is not working right. It is not classic for menopause where pituitary gland is actually working hard to stimulate ovaries. I would like to order MRI of pituitary gland. Are you ok with this? Any preference in imaging centers?

## 2024-08-03 ENCOUNTER — Telehealth: Payer: Self-pay | Admitting: Pharmacy Technician

## 2024-08-03 ENCOUNTER — Other Ambulatory Visit (HOSPITAL_COMMUNITY): Payer: Self-pay

## 2024-08-03 NOTE — Telephone Encounter (Signed)
 Pharmacy Patient Advocate Encounter   Received notification from [facility] KEY that prior authorization for [name]  is required/requested.   Insurance verification completed.   The patient is insured through [facility].   Per test claim: PA required; PA submitted to above mentioned insurance via Latent Key/confirmation #/EOC [id number] Status is pending

## 2024-08-10 ENCOUNTER — Other Ambulatory Visit (HOSPITAL_COMMUNITY): Payer: Self-pay

## 2024-08-11 ENCOUNTER — Other Ambulatory Visit (HOSPITAL_COMMUNITY): Payer: Self-pay

## 2024-08-12 ENCOUNTER — Other Ambulatory Visit (HOSPITAL_COMMUNITY): Payer: Self-pay

## 2024-08-12 NOTE — Telephone Encounter (Signed)
" ° °  She has tried: [name]  - works the best!!! LUnesta  -  Sonata - side effects  Trazodone  - Daytime sedation. Grogginess    Uniosom, melatnon - OTC  "

## 2024-08-12 NOTE — Telephone Encounter (Signed)
 Per CPHt Adrienne - the prior auth for [name]  was denied by the insurance. Denied due to only 1 of the alternative medications being tried (generic zaleplon ) - Other alternative medications include Generic zolpidem, generic eszopiclone , generic doxepin capsules. The patient has been updated of the insurance denial via a MyChart message.

## 2024-08-12 NOTE — Addendum Note (Signed)
 Addended by: Loyalty Arentz D on: 08/12/2024 10:10 AM   Modules accepted: Orders

## 2025-01-11 ENCOUNTER — Ambulatory Visit: Admitting: Physician Assistant
